# Patient Record
Sex: Female | Born: 2000
Health system: Southern US, Community
[De-identification: ages and names within clinical notes are randomized; demographics above are authoritative.]

## PROBLEM LIST (undated history)

## (undated) DIAGNOSIS — K219 Gastro-esophageal reflux disease without esophagitis: Secondary | ICD-10-CM

## (undated) DIAGNOSIS — Q673 Plagiocephaly: Secondary | ICD-10-CM

## (undated) DIAGNOSIS — D649 Anemia, unspecified: Secondary | ICD-10-CM

## (undated) DIAGNOSIS — T7840XA Allergy, unspecified, initial encounter: Secondary | ICD-10-CM

## (undated) HISTORY — DX: Anemia, unspecified: D64.9

## (undated) HISTORY — DX: Allergy, unspecified, initial encounter: T78.40XA

## (undated) HISTORY — DX: Plagiocephaly: Q67.3

## (undated) HISTORY — DX: Gastro-esophageal reflux disease without esophagitis: K21.9

---
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
## 2022-02-22 ENCOUNTER — Telehealth: Payer: Self-pay | Admitting: Family Medicine

## 2022-02-22 NOTE — Telephone Encounter (Signed)
Patient's mom informed shot record was ready. Verbalized understanding ?

## 2022-02-22 NOTE — Telephone Encounter (Signed)
Nurse- patient needing copy of shot record please,call when ready for pick up ?

## 2022-04-25 ENCOUNTER — Telehealth: Payer: Managed Care, Other (non HMO) | Admitting: Physician Assistant

## 2022-04-25 DIAGNOSIS — R3989 Other symptoms and signs involving the genitourinary system: Secondary | ICD-10-CM

## 2022-04-25 MED ORDER — NITROFURANTOIN MONOHYD MACRO 100 MG PO CAPS: 100.0000 mg | ORAL_CAPSULE | Freq: Two times a day (BID) | ORAL | 0 refills | Status: DC

## 2022-04-25 NOTE — Progress Notes (Signed)
I have spent 5 minutes in review of e-visit questionnaire, review and updating patient chart, medical decision making and response to patient.   Karrissa Parchment Cody Aleesia Henney, PA-C    

## 2022-04-25 NOTE — Progress Notes (Signed)

## 2022-07-23 ENCOUNTER — Ambulatory Visit: Payer: Managed Care, Other (non HMO) | Admitting: Family Medicine

## 2022-07-23 ENCOUNTER — Encounter: Payer: Self-pay | Admitting: Family Medicine

## 2022-07-23 VITALS — BP 116/67 | HR 92 | Temp 97.3°F | Ht 66.81 in | Wt 297.0 lb

## 2022-07-23 DIAGNOSIS — J029 Acute pharyngitis, unspecified: Secondary | ICD-10-CM | POA: Diagnosis not present

## 2022-07-23 DIAGNOSIS — H60312 Diffuse otitis externa, left ear: Secondary | ICD-10-CM | POA: Diagnosis not present

## 2022-07-23 LAB — POCT RAPID STREP A (OFFICE): Rapid Strep A Screen: NEGATIVE

## 2022-07-23 MED ORDER — CIPROFLOXACIN-DEXAMETHASONE 0.3-0.1 % OT SUSP: 4.0000 [drp] | Freq: Two times a day (BID) | OTIC | 0 refills | Status: AC

## 2022-07-23 NOTE — Progress Notes (Signed)
   Subjective:    Patient ID: Alison Bass, female    DOB: 2001/05/25, 21 y.o.   MRN: [id number]  HPI  Ear pain and soret throat started w/ recent beach trip  Patient relates a little bit of sore throat and ear pressure over the past couple days since going to the beach denies body aches fever chills sweats wheezing or difficulty breathing  Review of Systems     Objective:   Physical Exam  Eardrums are normal throat with enlarged tonsils neck no masses lungs clear      Assessment & Plan:  Rapid strep negative Viral syndrome Doubt COVID No testing indicated Call if any ongoing troubles Hold off on antibiotics currently  Patient is worried about getting a throat or sinus infection by the time she starts college next week I told her if she gets significantly worse to call us back

## 2022-09-05 ENCOUNTER — Ambulatory Visit: Payer: Managed Care, Other (non HMO)

## 2022-09-07 ENCOUNTER — Ambulatory Visit (INDEPENDENT_AMBULATORY_CARE_PROVIDER_SITE_OTHER): Payer: Managed Care, Other (non HMO) | Admitting: *Deleted

## 2022-09-07 DIAGNOSIS — Z23 Encounter for immunization: Secondary | ICD-10-CM

## 2022-09-26 ENCOUNTER — Other Ambulatory Visit (HOSPITAL_COMMUNITY): Payer: Self-pay

## 2022-10-03 ENCOUNTER — Encounter: Payer: Self-pay | Admitting: Emergency Medicine

## 2022-10-03 ENCOUNTER — Ambulatory Visit
Admission: EM | Admit: 2022-10-03 | Discharge: 2022-10-03 | Disposition: A | Discharge status: Home / Self Care | Payer: Managed Care, Other (non HMO) | Attending: Family Medicine | Admitting: Family Medicine

## 2022-10-03 ENCOUNTER — Other Ambulatory Visit: Payer: Self-pay

## 2022-10-03 DIAGNOSIS — N390 Urinary tract infection, site not specified: Secondary | ICD-10-CM

## 2022-10-03 LAB — POCT URINALYSIS DIP (MANUAL ENTRY)
Bilirubin, UA: NEGATIVE
Blood, UA: NEGATIVE
Glucose, UA: NEGATIVE mg/dL
Ketones, POC UA: NEGATIVE mg/dL
Nitrite, UA: NEGATIVE
Protein Ur, POC: NEGATIVE mg/dL
Spec Grav, UA: >=1.03 — AB (ref 1.010–1.025)
Urobilinogen, UA: 0.2 E.U./dL
pH, UA: 6 (ref 5.0–8.0)

## 2022-10-03 MED ORDER — NITROFURANTOIN MONOHYD MACRO 100 MG PO CAPS: 100.0000 mg | ORAL_CAPSULE | Freq: Two times a day (BID) | ORAL | 0 refills | Status: DC

## 2022-10-03 NOTE — ED Triage Notes (Signed)
Pt reports dysuria since  this afternoon. Pt reports history of UTI.

## 2022-10-04 LAB — URINE CULTURE

## 2022-10-05 NOTE — ED Provider Notes (Signed)
[facility]    CSN: [id number] Arrival date & time: 10/03/22  1900      History   Chief Complaint Chief Complaint  Patient presents with   Dysuria    HPI Alison Bass is a 21 y.o. female.   Presenting today with new onset dysuria that started this afternoon.  Denies fever, chills, hematuria, abdominal pain, nausea vomiting.  So far not tried anything over-the-counter for symptoms.  States history of UTIs that have started similarly.    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 No pertinent surgical history.  OB History   No obstetric history on file.      Home Medications    Prior to Admission medications   Medication Sig Start Date End Date Taking? Authorizing Provider  nitrofurantoin, macrocrystal-monohydrate, (MACROBID) 100 MG capsule Take 1 capsule (100 mg total) by mouth 2 (two) times daily. 10/03/22  Yes Volney American, PA-C  Adapalene 0.3 % gel Apply a small amount to skin every evening Patient not taking: Reported on 07/23/2022 10/23/21     Clindamycin Phos-Benzoyl Perox gel Apply a small amount to affected area every morning Patient not taking: Reported on 07/23/2022 10/23/21     doxycycline (VIBRAMYCIN) 100 MG capsule Take 1 capsule (100 mg total) by mouth  daily. Take with food Patient not taking: Reported on 07/23/2022 10/23/21     fluticasone (FLONASE) 50 MCG/ACT nasal spray Place 1 spray into both nostrils daily for 14 days. 07/03/20 07/17/20  Avegno, Darrelyn Hillock, FNP  naproxen (NAPROSYN) 375 MG tablet Take 1 tablet (375 mg total) by mouth 3 times/day as needed-between meals & bedtime. Patient not taking: Reported on 07/23/2022 09/02/21   Scot Jun, FNP  nitrofurantoin, macrocrystal-monohydrate, (MACROBID) 100 MG capsule Take 1 capsule (100 mg total) by mouth 2 (two) times daily. Patient not taking: Reported on 07/23/2022 04/25/22   Brunetta Jeans, PA-C  ipratropium (ATROVENT) 0.06 % nasal spray Place 2 sprays into both nostrils 4 (four) times daily. Patient not taking: Reported on 01/05/2019 12/07/18 10/15/20  Ok Edwards, PA-C    Family History Family History  Problem Relation Age of Onset   Diabetes Mother    Heart disease Paternal Grandfather        Died at 98    Social History Social History   Tobacco Use   Smoking status: Never   Smokeless tobacco: Never  Vaping Use   Vaping Use: Never used  Substance Use Topics   Alcohol use: No   Drug use: No     Allergies   Aloe, Cefzil [cefprozil], and Dimetapp c [phenylephrine-bromphen-codeine]   Review of Systems Review of Systems Per HPI  Physical Exam Triage Vital Signs ED Triage Vitals  Enc Vitals Group     BP 10/03/22 1910 132/88     Pulse Rate 10/03/22 1910 90  Resp 10/03/22 1910 20     Temp 10/03/22 1910 98.8 F (37.1 C)     Temp Source 10/03/22 1910 Oral     SpO2 10/03/22 1910 98 %     Weight --      Height --      Head Circumference --      Peak Flow --      Pain Score 10/03/22 1911 0     Pain Loc --      Pain Edu? --      Excl. in [place]? --    No data found.  Updated Vital Signs BP 132/88 (BP Location: Right Arm)   Pulse 90   Temp 98.8 F (37.1 C) (Oral)   Resp 20   LMP 09/09/2022 (Approximate)   SpO2 98%   Visual Acuity Right Eye Distance:    Left Eye Distance:   Bilateral Distance:    Right Eye Near:   Left Eye Near:    Bilateral Near:     Physical Exam Vitals and nursing note reviewed.  Constitutional:      Appearance: Normal appearance. She is not ill-appearing.  HENT:     Head: Atraumatic.     Mouth/Throat:     Mouth: Mucous membranes are moist.  Eyes:     Extraocular Movements: Extraocular movements intact.     Conjunctiva/sclera: Conjunctivae normal.  Cardiovascular:     Rate and Rhythm: Normal rate and regular rhythm.     Heart sounds: Normal heart sounds.  Pulmonary:     Effort: Pulmonary effort is normal.     Breath sounds: Normal breath sounds.  Abdominal:     General: Bowel sounds are normal. There is no distension.     Palpations: Abdomen is soft.     Tenderness: There is no abdominal tenderness. There is no right CVA tenderness, left CVA tenderness or guarding.  Musculoskeletal:        General: Normal range of motion.     Cervical back: Normal range of motion and neck supple.  Skin:    General: Skin is warm and dry.  Neurological:     Mental Status: She is alert and oriented to person, place, and time.  Psychiatric:        Mood and Affect: Mood normal.        Thought Content: Thought content normal.        Judgment: Judgment normal.    UC Treatments / Results  Labs (all labs ordered are listed, but only abnormal results are displayed) Labs Reviewed  URINE CULTURE - Abnormal; Notable for the following components:      Result Value   Culture MULTIPLE SPECIES PRESENT, SUGGEST RECOLLECTION (*)    All other components within normal limits  POCT URINALYSIS DIP (MANUAL ENTRY) - Abnormal; Notable for the following components:   Clarity, UA hazy (*)    Spec Grav, UA >=1.030 (*)    Leukocytes, UA Small (1+) (*)    All other components within normal limits    EKG   Radiology No results found.  Procedures Procedures (including critical care time)  Medications Ordered in UC Medications -  No data to display  Initial Impression / Assessment and Plan / UC Course  I have reviewed the triage vital signs and the nursing notes.  Pertinent labs & imaging results that were available during my care of the patient were reviewed by me and considered in my medical decision making (see chart for details).  Urinalysis with evidence of a possible UTI, start Macrobid while awaiting urine culture for confirmation.  Push fluids, Azo as needed.  Return for worsening symptoms.  Final Clinical Impressions(s) / UC Diagnoses   Final diagnoses:  Acute lower UTI   Discharge Instructions   None    ED Prescriptions     Medication Sig Dispense Auth. Provider   nitrofurantoin, macrocrystal-monohydrate, (MACROBID) 100 MG capsule Take 1 capsule (100 mg total) by mouth 2 (two) times daily. 10 capsule Volney American, [place]      PDMP not reviewed this encounter.   Volney American, [place] 10/05/22 1356

## 2023-02-08 ENCOUNTER — Encounter: Payer: Self-pay | Admitting: Nurse Practitioner

## 2023-02-08 ENCOUNTER — Ambulatory Visit: Payer: Managed Care, Other (non HMO) | Admitting: Nurse Practitioner

## 2023-02-08 VITALS — BP 128/82 | Ht 66.0 in | Wt 300.4 lb

## 2023-02-08 DIAGNOSIS — Z130 Encounter for screening for diseases of the blood and blood-forming organs and certain disorders involving the immune mechanism: Secondary | ICD-10-CM

## 2023-02-08 DIAGNOSIS — Z13220 Encounter for screening for lipoid disorders: Secondary | ICD-10-CM

## 2023-02-08 DIAGNOSIS — Z131 Encounter for screening for diabetes mellitus: Secondary | ICD-10-CM

## 2023-02-08 DIAGNOSIS — N939 Abnormal uterine and vaginal bleeding, unspecified: Secondary | ICD-10-CM | POA: Diagnosis not present

## 2023-02-08 DIAGNOSIS — E282 Polycystic ovarian syndrome: Secondary | ICD-10-CM

## 2023-02-08 DIAGNOSIS — Z1329 Encounter for screening for other suspected endocrine disorder: Secondary | ICD-10-CM

## 2023-02-08 MED ORDER — METFORMIN HCL 500 MG PO TABS: 500.0000 mg | ORAL_TABLET | Freq: Two times a day (BID) | ORAL | 2 refills | Status: DC

## 2023-02-08 MED ORDER — NORETHIN ACE-ETH ESTRAD-FE 1-20 MG-MCG PO TABS: 1.0000 | ORAL_TABLET | Freq: Every day | ORAL | 2 refills | Status: DC

## 2023-02-08 NOTE — Progress Notes (Unsigned)
Subjective:    Patient ID: Alison Bass, female    DOB: 08/13/2001, 22 y.o.   MRN: [id number]  HPI Alison Bass is being seen today to discuss her periods. They are very irregular, and last anywhere for up to one month at a time. Her periods are heavy with blood clots most of the days when she has her periods. She will go through 5-6 super pads per day. She does not have any severe cramping. She is not currently on birth control. Her step mom has some reservations about birth control pills, and advised against it due to some issues she has had in the past, so Alison Bass has not ever been on birth control. She is sexually active, and has had two female partners in the last year. She does use condoms. She has had some issues with weight loss as well, as she has been on diets in the past and lost weight, but has been gaining it back months later. She has cut back on soft drinks and carb containing foods. She has also had some issues with acne and excessive hair growth on her body. No smoking or vaping.    Current Medications, Allergies, Past Medical History, Past Surgical History, Family History and Social History were reviewed in [employer] record.    Review of Systems  Constitutional:  Positive for unexpected weight change. Negative for activity change, appetite change, fatigue and fever.  Respiratory:  Negative for cough, chest tightness, shortness of breath and wheezing.   Cardiovascular:  Negative for chest pain and palpitations.  Gastrointestinal:  Negative for constipation, diarrhea, nausea and vomiting.  Genitourinary:  Positive for menstrual problem and vaginal bleeding. Negative for pelvic pain, vaginal discharge and vaginal pain.      02/08/2023    8:52 AM  Depression screen PHQ 2/9  Decreased Interest 0  Down, Depressed, Hopeless 0  PHQ - 2 Score 0       Objective:   Physical Exam Vitals and nursing note reviewed.  Constitutional:      General: She is  not in acute distress.    Appearance: Normal appearance.  HENT:     Nose: No congestion or rhinorrhea.     Mouth/Throat:     Mouth: Mucous membranes are moist.     Pharynx: Oropharynx is clear. No posterior oropharyngeal erythema.  Eyes:     Conjunctiva/sclera: Conjunctivae normal.  Neck:     Thyroid: No thyroid mass or thyroid tenderness.     Comments: Thyroid non tender, no mass or goiter noted.  Cardiovascular:     Rate and Rhythm: Normal rate and regular rhythm.     Heart sounds: Normal heart sounds. No murmur heard. Pulmonary:     Effort: Pulmonary effort is normal. No respiratory distress.     Breath sounds: Normal breath sounds.  Chest:  Breasts:    Breasts are symmetrical.  Abdominal:     General: There is no distension.     Palpations: Abdomen is soft. There is no mass.     Tenderness: There is no abdominal tenderness.  Genitourinary:    Comments: Defers GU exam.  Lymphadenopathy:     Cervical: No cervical adenopathy.  Skin:    General: Skin is warm and dry.     Comments: She does have some mild acne scarring on her face, and acne on her back and chest. She does have some increased dark hair growth on her lateral face and lower back.  Neurological:  Mental Status: She is alert.     Gait: Gait normal.  Psychiatric:        Mood and Affect: Mood normal.        Behavior: Behavior normal.        Thought Content: Thought content normal.        Judgment: Judgment normal.    Vitals:   02/08/23 0850  BP: 128/82  Height: '5\' 6"'$  (1.676 m)  Weight: (!) 300 lb 6.4 oz (136.3 kg)  BMI (Calculated): 48.51        Assessment & Plan:   1. Screening, lipid - Lipid panel  2. Encounter for screening examination for impaired glucose regulation and diabetes mellitus - CMP14+EGFR  3. Screening for thyroid disorder - TSH  4. Screening for iron deficiency anemia - CBC with Differential/Platelet  5. PCOS (polycystic ovarian syndrome): Primary diagnosis Meds ordered  this encounter  Medications   norethindrone-ethinyl estradiol-FE (LOESTRIN FE 1/20) 1-20 MG-MCG tablet    Sig: Take 1 tablet by mouth daily. Start taking this Sunday. Use back up contraceptive with the first pack.    Dispense:  28 tablet    Refill:  2    Order Specific Question:   Supervising Provider    Answer:   Sallee Lange A [[id number]]   metFORMIN (GLUCOPHAGE) 500 MG tablet    Sig: Take 1 tablet (500 mg total) by mouth 2 (two) times daily with a meal.    Dispense:  60 tablet    Refill:  2    Order Specific Question:   Supervising Provider    Answer:   Kathyrn Drown [phone]   -Educated patient on side effects of Metformin, and to follow up if any severe side effects. -Educated patient on symptoms of PCOS. -Discussed importance of healthy diet and regular activity. Initial goal is weight loss of 10 lbs over next 3 months.  6. Abnormal uterine bleeding -Prescribed contraceptive pills to control bleeding and abnormal cycles,   -Educated patient on the risks associated with increased uterine bleeding.  -Educated patient on the risks of contraceptive pills. Call back if continued heavy or prolonged bleeding.  Return in about 3 months (around 05/09/2023).

## 2023-02-08 NOTE — Patient Instructions (Addendum)
Metformin  Mediterranean Diet A Mediterranean diet refers to food and lifestyle choices that are based on the traditions of countries located on the [employer]. It focuses on eating more fruits, vegetables, whole grains, beans, nuts, seeds, and heart-healthy fats, and eating less dairy, meat, eggs, and processed foods with added sugar, salt, and fat. This way of eating has been shown to help prevent certain conditions and improve outcomes for people who have chronic diseases, like kidney disease and heart disease. What are tips for following this plan? Reading food labels Check the serving size of packaged foods. For foods such as rice and pasta, the serving size refers to the amount of cooked product, not dry. Check the total fat in packaged foods. Avoid foods that have saturated fat or trans fats. Check the ingredient list for added sugars, such as corn syrup. Shopping  Buy a variety of foods that offer a balanced diet, including: Fresh fruits and vegetables (produce). Grains, beans, nuts, and seeds. Some of these may be available in unpackaged forms or large amounts (in bulk). Fresh seafood. Poultry and eggs. Low-fat dairy products. Buy whole ingredients instead of prepackaged foods. Buy fresh fruits and vegetables in-season from local farmers markets. Buy plain frozen fruits and vegetables. If you do not have access to quality fresh seafood, buy precooked frozen shrimp or canned fish, such as tuna, salmon, or sardines. Stock your pantry so you always have certain foods on hand, such as olive oil, canned tuna, canned tomatoes, rice, pasta, and beans. Cooking Cook foods with extra-virgin olive oil instead of using butter or other vegetable oils. Have meat as a side dish, and have vegetables or grains as your main dish. This means having meat in small portions or adding small amounts of meat to foods like pasta or stew. Use beans or vegetables instead of meat in common dishes like  chili or lasagna. Experiment with different cooking methods. Try roasting, broiling, steaming, and sauting vegetables. Add frozen vegetables to soups, stews, pasta, or rice. Add nuts or seeds for added healthy fats and plant protein at each meal. You can add these to yogurt, salads, or vegetable dishes. Marinate fish or vegetables using olive oil, lemon juice, garlic, and fresh herbs. Meal planning Plan to eat one vegetarian meal one day each week. Try to work up to two vegetarian meals, if possible. Eat seafood two or more times a week. Have healthy snacks readily available, such as: Vegetable sticks with hummus. Greek yogurt. Fruit and nut trail mix. Eat balanced meals throughout the week. This includes: Fruit: 2-3 servings a day. Vegetables: 4-5 servings a day. Low-fat dairy: 2 servings a day. Fish, poultry, or lean meat: 1 serving a day. Beans and legumes: 2 or more servings a week. Nuts and seeds: 1-2 servings a day. Whole grains: 6-8 servings a day. Extra-virgin olive oil: 3-4 servings a day. Limit red meat and sweets to only a few servings a month. Lifestyle  Cook and eat meals together with your family, when possible. Drink enough fluid to keep your urine pale yellow. Be physically active every day. This includes: Aerobic exercise like running or swimming. Leisure activities like gardening, walking, or housework. Get 7-8 hours of sleep each night. If recommended by your health care provider, drink red wine in moderation. This means 1 glass a day for nonpregnant women and 2 glasses a day for men. A glass of wine equals 5 oz (150 mL). What foods should I eat? Fruits Apples. Apricots. Avocado. Berries. Bananas.  Cherries. Dates. Figs. Grapes. Lemons. Melon. Oranges. Peaches. Plums. Pomegranate. Vegetables Artichokes. Beets. Broccoli. Cabbage. Carrots. Eggplant. Green beans. Chard. Kale. Spinach. Onions. Leeks. Peas. Squash. Tomatoes. Peppers. Radishes. Grains Whole-grain  pasta. Brown rice. Bulgur wheat. Polenta. Couscous. Whole-wheat bread. [place]. Meats and other proteins Beans. Almonds. Sunflower seeds. Pine nuts. Peanuts. [place]. Salmon. Scallops. Shrimp. [place]. Tilapia. Clams. Oysters. Eggs. Poultry without skin. Dairy Low-fat milk. Cheese. Greek yogurt. Fats and oils Extra-virgin olive oil. Avocado oil. Grapeseed oil. Beverages Water. Red wine. Herbal tea. Sweets and desserts Greek yogurt with honey. Baked apples. Poached pears. Trail mix. Seasonings and condiments Basil. Cilantro. Coriander. Cumin. Mint. Parsley. Sage. Rosemary. Tarragon. Garlic. Oregano. Thyme. Pepper. Balsamic vinegar. Tahini. Hummus. Tomato sauce. Olives. Mushrooms. The items listed above may not be a complete list of foods and beverages you can eat. Contact a dietitian for more information. What foods should I limit? This is a list of foods that should be eaten rarely or only on special occasions. Fruits Fruit canned in syrup. Vegetables Deep-fried potatoes (french fries). Grains Prepackaged pasta or rice dishes. Prepackaged cereal with added sugar. Prepackaged snacks with added sugar. Meats and other proteins Beef. Pork. Lamb. Poultry with skin. Hot dogs. Berniece Salines. Dairy Ice cream. Sour cream. Whole milk. Fats and oils Butter. Canola oil. Vegetable oil. Beef fat (tallow). Lard. Beverages Juice. Sugar-sweetened soft drinks. Beer. Liquor and spirits. Sweets and desserts Cookies. Cakes. Pies. Candy. Seasonings and condiments Mayonnaise. Pre-made sauces and marinades. The items listed above may not be a complete list of foods and beverages you should limit. Contact a dietitian for more information. Summary The Mediterranean diet includes both food and lifestyle choices. Eat a variety of fresh fruits and vegetables, beans, nuts, seeds, and whole grains. Limit the amount of red meat and sweets that you eat. If recommended by your health care provider, drink red wine in  moderation. This means 1 glass a day for nonpregnant women and 2 glasses a day for men. A glass of wine equals 5 oz (150 mL). This information is not intended to replace advice given to you by your health care provider. Make sure you discuss any questions you have with your health care provider. Document Revised: 01/08/2020 Document Reviewed: 11/05/2019 Elsevier Patient Education  [place].

## 2023-02-08 NOTE — Progress Notes (Unsigned)
   Subjective:    Patient ID: Alison Bass, female    DOB: 12-23-2000, 22 y.o.   MRN: [id number]  HPI Patient arrives to discuss irregular cycles    Review of Systems     Objective:   Physical Exam        Assessment & Plan:

## 2023-02-09 ENCOUNTER — Encounter: Payer: Self-pay | Admitting: Nurse Practitioner

## 2023-02-09 LAB — CBC WITH DIFFERENTIAL/PLATELET
Basophils Absolute: 0.1 10*3/uL (ref 0.0–0.2)
Basos: 1 %
EOS (ABSOLUTE): 0.2 10*3/uL (ref 0.0–0.4)
Eos: 2 %
Hematocrit: 36.7 % (ref 34.0–46.6)
Hemoglobin: 11 g/dL — ABNORMAL LOW (ref 11.1–15.9)
Immature Grans (Abs): 0.1 10*3/uL (ref 0.0–0.1)
Immature Granulocytes: 1 %
Lymphocytes Absolute: 2.8 10*3/uL (ref 0.7–3.1)
Lymphs: 21 %
MCH: 20.9 pg — ABNORMAL LOW (ref 26.6–33.0)
MCHC: 30 g/dL — ABNORMAL LOW (ref 31.5–35.7)
MCV: 70 fL — ABNORMAL LOW (ref 79–97)
Monocytes Absolute: 0.8 10*3/uL (ref 0.1–0.9)
Monocytes: 6 %
Neutrophils Absolute: 9.8 10*3/uL — ABNORMAL HIGH (ref 1.4–7.0)
Neutrophils: 69 %
Platelets: 467 10*3/uL — ABNORMAL HIGH (ref 150–450)
RBC: 5.26 x10E6/uL (ref 3.77–5.28)
RDW: 17.2 % — ABNORMAL HIGH (ref 11.7–15.4)
WBC: 13.8 10*3/uL — ABNORMAL HIGH (ref 3.4–10.8)

## 2023-02-09 LAB — CMP14+EGFR
ALT: 23 IU/L (ref 0–32)
AST: 20 IU/L (ref 0–40)
Albumin/Globulin Ratio: 1.4 (ref 1.2–2.2)
Albumin: 4.2 g/dL (ref 4.0–5.0)
Alkaline Phosphatase: 125 IU/L — ABNORMAL HIGH (ref 42–106)
BUN/Creatinine Ratio: 14 (ref 9–23)
BUN: 11 mg/dL (ref 6–20)
Bilirubin Total: 0.3 mg/dL (ref 0.0–1.2)
CO2: 21 mmol/L (ref 20–29)
Calcium: 9.2 mg/dL (ref 8.7–10.2)
Chloride: 102 mmol/L (ref 96–106)
Creatinine, Ser: 0.78 mg/dL (ref 0.57–1.00)
Globulin, Total: 3.1 g/dL (ref 1.5–4.5)
Glucose: 87 mg/dL (ref 70–99)
Potassium: 4.4 mmol/L (ref 3.5–5.2)
Sodium: 140 mmol/L (ref 134–144)
Total Protein: 7.3 g/dL (ref 6.0–8.5)
eGFR: 112 mL/min/{1.73_m2} (ref >59)

## 2023-02-09 LAB — LIPID PANEL
Chol/HDL Ratio: 3.7 ratio (ref 0.0–4.4)
Cholesterol, Total: 163 mg/dL (ref 100–169)
HDL: 44 mg/dL (ref >39)
LDL Chol Calc (NIH): 100 mg/dL (ref 0–109)
Triglycerides: 106 mg/dL — ABNORMAL HIGH (ref 0–89)
VLDL Cholesterol Cal: 19 mg/dL (ref 5–40)

## 2023-02-09 LAB — TSH: TSH: 2.32 u[IU]/mL (ref 0.450–4.500)

## 2023-02-15 ENCOUNTER — Other Ambulatory Visit: Payer: Self-pay | Admitting: Nurse Practitioner

## 2023-03-01 ENCOUNTER — Other Ambulatory Visit: Payer: Self-pay | Admitting: Nurse Practitioner

## 2023-03-01 MED ORDER — DESOGESTREL-ETHINYL ESTRADIOL 0.15-0.02/0.01 MG (21/5) PO TABS: 1.0000 | ORAL_TABLET | Freq: Every day | ORAL | 2 refills | Status: DC

## 2023-03-04 ENCOUNTER — Telehealth: Payer: Self-pay | Admitting: Family Medicine

## 2023-03-04 NOTE — Telephone Encounter (Signed)
Patient is requesting copy of shot record.as soon as possible

## 2023-03-06 NOTE — Telephone Encounter (Signed)
Patient informed shot records are ready for pickup.

## 2023-03-22 ENCOUNTER — Ambulatory Visit: Payer: Managed Care, Other (non HMO) | Admitting: Nurse Practitioner

## 2023-03-22 ENCOUNTER — Encounter: Payer: Self-pay | Admitting: Nurse Practitioner

## 2023-03-22 VITALS — BP 135/84 | HR 91 | Temp 99.0°F | Ht 66.0 in | Wt 291.0 lb

## 2023-03-22 DIAGNOSIS — E282 Polycystic ovarian syndrome: Secondary | ICD-10-CM

## 2023-03-22 DIAGNOSIS — N939 Abnormal uterine and vaginal bleeding, unspecified: Secondary | ICD-10-CM | POA: Diagnosis not present

## 2023-03-22 LAB — POCT URINE PREGNANCY: Preg Test, Ur: NEGATIVE

## 2023-03-22 MED ORDER — MEGESTROL ACETATE 40 MG PO TABS: ORAL_TABLET | ORAL | 0 refills | Status: DC

## 2023-03-22 NOTE — Progress Notes (Unsigned)
   Subjective:    Patient ID: Alison Bass, female    DOB: Jan 06, 2001, 22 y.o.   MRN: [id number]  HPI Heavy menstrual cycles going on for 3 months  Cycles are heavier w clots    Review of Systems     Objective:   Physical Exam        Assessment & Plan:

## 2023-03-22 NOTE — Progress Notes (Unsigned)
   Subjective:    Patient ID: Alison Bass, female    DOB: October 23, 2001, 22 y.o.   MRN: [id number]  HPI Alison Bass is being seen today for heavy menstrual bleeding. Patient was seen on 02/08/2023 with heavy bleeding, and we started her on Loestrin. Bleeding still did not stop. We switched her to Mircette, and she has been taking that for 3 weeks. She continues to have heavy bleeding with clots. She is going through 4-5 size 5 pads per day. Has been sexually active during this time, but does use condoms as well. Since taking the Mircette, she has had labile emotions, decreased energy, diarrhea and constipation. Denies dizziness, lightheadedness, or palpitations.   Review of Systems  Constitutional:  Negative for fever.  Respiratory:  Negative for chest tightness, shortness of breath and wheezing.   Cardiovascular:  Negative for chest pain and palpitations.  Genitourinary:  Positive for menstrual problem and vaginal bleeding.  Skin:  Negative for pallor.  Neurological:  Negative for weakness and light-headedness.      Objective:   Physical Exam Vitals and nursing note reviewed.  Constitutional:      Appearance: She is not ill-appearing.  Cardiovascular:     Rate and Rhythm: Normal rate and regular rhythm.     Pulses: Normal pulses.     Heart sounds: Normal heart sounds. No murmur heard. Pulmonary:     Effort: Pulmonary effort is normal. No respiratory distress.     Breath sounds: No stridor. No wheezing.  Skin:    General: Skin is warm and dry.     Coloration: Skin is not pale.  Neurological:     Mental Status: She is alert.  Psychiatric:        Mood and Affect: Mood normal.        Behavior: Behavior normal.        Thought Content: Thought content normal.        Judgment: Judgment normal.    Vitals:   03/22/23 1556  BP: 135/84  Pulse: 91  Temp: 99 F (37.2 C)  Height: 5\' 6"  (1.676 m)  Weight: 291 lb (132 kg)  SpO2: 100%  BMI (Calculated): 46.99    Results for orders  placed or performed in visit on 03/22/23  POCT urine pregnancy  Result Value Ref Range   Preg Test, Ur Negative Negative      Assessment & Plan:  1. Abnormal uterine bleeding - POCT urine pregnancy  Meds ordered this encounter  Medications   megestrol (MEGACE) 40 MG tablet    Sig: Take 3 tabs po qd x 5 d then 2 qd x 5 d then one qd    Dispense:  30 tablet    Refill:  0    Order Specific Question:   Supervising Provider    Answer:   Babs Sciara [phone]    -Educated patient that if no improvement with menstrual cycle in 7 days that she needs to contact the office.  -Educated patient on warning signs associated with anemia such as dizziness, lightheadedness, and palpitations.   2. PCOS (polycystic ovarian syndrome)  Return for Recheck 4/26.

## 2023-03-22 NOTE — Telephone Encounter (Signed)
Patient seen 03/22/23

## 2023-03-24 ENCOUNTER — Encounter: Payer: Self-pay | Admitting: Nurse Practitioner

## 2023-04-12 ENCOUNTER — Ambulatory Visit (INDEPENDENT_AMBULATORY_CARE_PROVIDER_SITE_OTHER): Payer: Managed Care, Other (non HMO) | Admitting: Nurse Practitioner

## 2023-04-12 VITALS — BP 125/82 | Ht 66.0 in | Wt 290.6 lb

## 2023-04-12 DIAGNOSIS — N939 Abnormal uterine and vaginal bleeding, unspecified: Secondary | ICD-10-CM | POA: Diagnosis not present

## 2023-04-12 DIAGNOSIS — N920 Excessive and frequent menstruation with regular cycle: Secondary | ICD-10-CM

## 2023-04-12 MED ORDER — NORETHIN ACE-ETH ESTRAD-FE 1-20 MG-MCG PO TABS: 1.0000 | ORAL_TABLET | Freq: Every day | ORAL | 11 refills | Status: DC

## 2023-04-12 NOTE — Progress Notes (Signed)
Subjective:    Patient ID: Alison Bass, female    DOB: 2001-09-06, 22 y.o.   MRN: [id number]  HPI Patient arrives for a follow up on menstrual bleeding. Patient states her cycle has finally stopped. She has had some spotting twice since her period has stopped. She has been having some episodes of dizziness while she is at work and walking a lot. She says the dizziness was a lot worse when she was on her menstrual cycle, and it has improved. Denies any palpitations.  Review of Systems  Constitutional:  Negative for fatigue and fever.  Respiratory:  Negative for cough, chest tightness, shortness of breath and wheezing.   Cardiovascular:  Negative for chest pain and palpitations.  Genitourinary:  Negative for menstrual problem and vaginal bleeding.  Neurological:  Positive for dizziness and light-headedness.      Objective:   Physical Exam Vitals and nursing note reviewed.  Constitutional:      General: She is not in acute distress. Cardiovascular:     Rate and Rhythm: Normal rate and regular rhythm.     Pulses: Normal pulses.     Heart sounds: S1 normal and S2 normal. No murmur heard. Pulmonary:     Effort: Pulmonary effort is normal. No respiratory distress.     Breath sounds: No wheezing.  Skin:    General: Skin is warm and dry.     Coloration: Skin is not pale.  Neurological:     Mental Status: She is alert.  Psychiatric:        Mood and Affect: Mood normal.        Behavior: Behavior normal.        Thought Content: Thought content normal.        Judgment: Judgment normal.    Vitals:   04/12/23 1541  BP: 125/82  Height: 5\' 6"  (1.676 m)  Weight: 290 lb 9.6 oz (131.8 kg)  BMI (Calculated): 46.93          04/12/2023    3:41 PM 03/22/2023    4:06 PM 02/08/2023    8:52 AM 07/17/2021    4:37 PM  Depression screen PHQ 2/9  Decreased Interest 0 1 0 2  Down, Depressed, Hopeless 0 1 0 1  PHQ - 2 Score 0 2 0 3  Altered sleeping 0 1  0  Tired, decreased energy 0 2   1  Change in appetite 0 2  3  Feeling bad or failure about yourself  0 0  1  Trouble concentrating 0 0  0  Moving slowly or fidgety/restless 0 0  0  Suicidal thoughts 0 0    PHQ-9 Score 0 7  8  Difficult doing work/chores Not difficult at all Not difficult at all      Assessment & Plan:  1. Menorrhagia with regular cycle - CBC with Differential/Platelet - Iron, TIBC and Ferritin Panel -Encouraged her to take the Metformin as prescribed now that we have her menstrual cycle under control. Educated her on signs and symptoms of the medicine.   2. Abnormal uterine bleeding - CBC with Differential/Platelet - Iron, TIBC and Ferritin Panel   Meds ordered this encounter  Medications   norethindrone-ethinyl estradiol-FE (LOESTRIN FE 1/20) 1-20 MG-MCG tablet    Sig: Take 1 tablet by mouth daily. Start pack on Sunday.    Dispense:  28 tablet    Refill:  11    Order Specific Question:   Supervising Provider    Answer:  Lilyan Punt A [[id number]]    Return in about 3 months (around 07/12/2023).

## 2023-04-12 NOTE — Progress Notes (Unsigned)
   Subjective:    Patient ID: Alison Bass, female    DOB: 05-04-2001, 22 y.o.   MRN: [id number]  HPI Patient arrives for a follow up on menstrual bleeding. Patient states her cycle has finally stopped.   Review of Systems     Objective:   Physical Exam        Assessment & Plan:

## 2023-04-13 ENCOUNTER — Telehealth: Payer: Managed Care, Other (non HMO) | Admitting: Family Medicine

## 2023-04-13 ENCOUNTER — Encounter: Payer: Self-pay | Admitting: Nurse Practitioner

## 2023-04-13 DIAGNOSIS — R7989 Other specified abnormal findings of blood chemistry: Secondary | ICD-10-CM | POA: Insufficient documentation

## 2023-04-13 DIAGNOSIS — J039 Acute tonsillitis, unspecified: Secondary | ICD-10-CM

## 2023-04-13 DIAGNOSIS — D72829 Elevated white blood cell count, unspecified: Secondary | ICD-10-CM | POA: Insufficient documentation

## 2023-04-13 DIAGNOSIS — D729 Disorder of white blood cells, unspecified: Secondary | ICD-10-CM | POA: Insufficient documentation

## 2023-04-13 DIAGNOSIS — D509 Iron deficiency anemia, unspecified: Secondary | ICD-10-CM | POA: Insufficient documentation

## 2023-04-13 DIAGNOSIS — R790 Abnormal level of blood mineral: Secondary | ICD-10-CM | POA: Insufficient documentation

## 2023-04-13 DIAGNOSIS — D75839 Thrombocytosis, unspecified: Secondary | ICD-10-CM | POA: Insufficient documentation

## 2023-04-13 LAB — CBC WITH DIFFERENTIAL/PLATELET
Basophils Absolute: 0.1 10*3/uL (ref 0.0–0.2)
Basos: 1 %
EOS (ABSOLUTE): 0.2 10*3/uL (ref 0.0–0.4)
Eos: 1 %
Hematocrit: 37.4 % (ref 34.0–46.6)
Hemoglobin: 11 g/dL — ABNORMAL LOW (ref 11.1–15.9)
Immature Grans (Abs): 0 10*3/uL (ref 0.0–0.1)
Immature Granulocytes: 0 %
Lymphocytes Absolute: 3.3 10*3/uL — ABNORMAL HIGH (ref 0.7–3.1)
Lymphs: 22 %
MCH: 20.1 pg — ABNORMAL LOW (ref 26.6–33.0)
MCHC: 29.4 g/dL — ABNORMAL LOW (ref 31.5–35.7)
MCV: 68 fL — ABNORMAL LOW (ref 79–97)
Monocytes Absolute: 0.9 10*3/uL (ref 0.1–0.9)
Monocytes: 6 %
Neutrophils Absolute: 10.4 10*3/uL — ABNORMAL HIGH (ref 1.4–7.0)
Neutrophils: 70 %
Platelets: 478 10*3/uL — ABNORMAL HIGH (ref 150–450)
RBC: 5.48 x10E6/uL — ABNORMAL HIGH (ref 3.77–5.28)
RDW: 15.9 % — ABNORMAL HIGH (ref 11.7–15.4)
WBC: 15 10*3/uL — ABNORMAL HIGH (ref 3.4–10.8)

## 2023-04-13 LAB — IRON,TIBC AND FERRITIN PANEL
Ferritin: 13 ng/mL — ABNORMAL LOW (ref 15–77)
Iron Saturation: 4 % — CL (ref 15–55)
Iron: 16 ug/dL — ABNORMAL LOW (ref 27–159)
Total Iron Binding Capacity: 412 ug/dL (ref 250–450)
UIBC: 396 ug/dL (ref 131–425)

## 2023-04-13 MED ORDER — AMOXICILLIN 875 MG PO TABS: 875.0000 mg | ORAL_TABLET | Freq: Two times a day (BID) | ORAL | 0 refills | Status: AC

## 2023-04-13 NOTE — Progress Notes (Signed)

## 2023-04-16 ENCOUNTER — Ambulatory Visit: Payer: Managed Care, Other (non HMO) | Admitting: Family Medicine

## 2023-05-03 ENCOUNTER — Ambulatory Visit (INDEPENDENT_AMBULATORY_CARE_PROVIDER_SITE_OTHER): Payer: Managed Care, Other (non HMO) | Admitting: Nurse Practitioner

## 2023-05-03 ENCOUNTER — Encounter: Payer: Self-pay | Admitting: Nurse Practitioner

## 2023-05-03 VITALS — BP 116/81 | HR 112 | Temp 98.4°F | Ht 66.0 in

## 2023-05-03 DIAGNOSIS — D500 Iron deficiency anemia secondary to blood loss (chronic): Secondary | ICD-10-CM | POA: Diagnosis not present

## 2023-05-03 DIAGNOSIS — D729 Disorder of white blood cells, unspecified: Secondary | ICD-10-CM

## 2023-05-03 DIAGNOSIS — N939 Abnormal uterine and vaginal bleeding, unspecified: Secondary | ICD-10-CM | POA: Diagnosis not present

## 2023-05-03 DIAGNOSIS — R790 Abnormal level of blood mineral: Secondary | ICD-10-CM

## 2023-05-03 DIAGNOSIS — N920 Excessive and frequent menstruation with regular cycle: Secondary | ICD-10-CM

## 2023-05-03 NOTE — Progress Notes (Signed)
   Subjective:    Patient ID: Alison Bass, female    DOB: 27-Jan-2001, 22 y.o.   MRN: [id number]  HPI  Follow up on irregular menstrual cycle  Cycle ended on 04/06/23 and restarted and ended 04/26/23. Has started taking her birth control pills.  No breakthrough bleeding at this point.  Has been on them for about a week.  Denies any mood swings.  Has started taking oral iron pills over-the-counter. States the day after she was seen on 4/26, she did an e-visit and was started on amoxicillin for possible tonsillitis.  Had 2 visits to local urgent care after this.  Completed the first antibiotic and started a second one.  Finished that antibiotic this morning.  The second visit was for ear pain which has improved.  Review of Systems  Constitutional:  Negative for fever.  HENT:  Positive for ear pain and sinus pressure. Negative for sore throat.   Respiratory:  Negative for cough, chest tightness, shortness of breath and wheezing.        Objective:   Physical Exam NAD.  Alert, oriented.  TMs clear effusion, no erythema.  Nasal mucosa mildly boggy.  Pharynx clear.  1-2+ tonsils but patient states this is normal for her.  No exudate noted.  Neck supple with mild soft anterior adenopathy.  Lungs clear.  Heart regular rate rhythm. Today's Vitals   05/03/23 1009  BP: 116/81  Pulse: (!) 112  Temp: 98.4 F (36.9 C)  SpO2: 98%  Height: 5\' 6"  (1.676 m)   Body mass index is 46.9 kg/m.        Assessment & Plan:   Problem List Items Addressed This Visit       Genitourinary   Abnormal uterine bleeding     Other   Iron deficiency anemia - Primary   Relevant Orders   CBC with Differential/Platelet   Iron, TIBC and Ferritin Panel   Low iron stores   Low serum ferritin level   Menorrhagia with regular cycle   Neutrophilia   The illness around the time of the lab work may explain the elevated WBC count but this will be rechecked with her CBC in a couple of weeks. Continue daily  oral iron therapy.  Start OTC multivitamin as well. Continue oral contraceptive, call back if any heavy or prolonged bleeding. Repeat labs ordered to be completed in 2 weeks.  Patient verbalizes understanding. Recommend preventive health physical in the next few months. Return in about 6 months (around 11/03/2023).

## 2023-05-03 NOTE — Patient Instructions (Signed)
Repeat labs in 2 months.

## 2023-05-05 ENCOUNTER — Other Ambulatory Visit: Payer: Self-pay | Admitting: Nurse Practitioner

## 2023-05-10 ENCOUNTER — Telehealth: Payer: Managed Care, Other (non HMO) | Admitting: Nurse Practitioner

## 2023-05-10 DIAGNOSIS — R300 Dysuria: Secondary | ICD-10-CM

## 2023-05-10 MED ORDER — NITROFURANTOIN MONOHYD MACRO 100 MG PO CAPS
100.0000 mg | ORAL_CAPSULE | Freq: Two times a day (BID) | ORAL | 0 refills | Status: AC
Start: 2023-05-10 — End: 2023-05-15

## 2023-05-10 NOTE — Progress Notes (Signed)

## 2023-05-20 ENCOUNTER — Telehealth: Payer: Managed Care, Other (non HMO) | Admitting: Physician Assistant

## 2023-05-20 DIAGNOSIS — N390 Urinary tract infection, site not specified: Secondary | ICD-10-CM

## 2023-05-20 NOTE — Progress Notes (Signed)
Because you were just treated for this via evisit with recurring symptoms and need for exam and testing, I feel your condition warrants further evaluation and I recommend that you be seen in a face to face visit.   NOTE: There will be NO CHARGE for this eVisit   If you are having a true medical emergency please call 911.      For an urgent face to face visit, [facility] has eight urgent care centers for your convenience:   NEW!! [facility] at [facility] Get Driving Directions [phone] [address] [place], [address]    [facility]gent Care Center at [facility] Get Driving Directions [phone] [address] [place], [name] [address]   [facility] Health Urgent Care Center [facility]) Get Driving Directions [phone] [address] [place], [name] [address]  [facility] Health Urgent Care Center [facility] [place]) Get Driving Directions [phone] [address] [place],  [name]  [address]  [facility] Health Urgent Care Center [facility] - at [employer]  [phone] [address] [place],  [name] [address]   [facility] Health Urgent Care at [facility] Get Driving Directions [phone] [address] [place], [name] [address]   [facility] Health Urgent Care at [facility] Get Driving Directions  [phone] [address] [place], [name] [address]   [facility] Health Urgent Care at [facility] Directions [phone] [address] [place], [name] [address]  Your MyChart E-visit questionnaire answers were reviewed by a board certified advanced clinical practitioner to complete your personal care plan based on your specific symptoms.  Thank you for using e-Visits.

## 2023-05-21 ENCOUNTER — Other Ambulatory Visit: Payer: Self-pay | Admitting: Nurse Practitioner

## 2023-09-16 ENCOUNTER — Telehealth: Payer: Managed Care, Other (non HMO) | Admitting: Nurse Practitioner

## 2023-09-16 DIAGNOSIS — R3989 Other symptoms and signs involving the genitourinary system: Secondary | ICD-10-CM | POA: Diagnosis not present

## 2023-09-16 MED ORDER — CEPHALEXIN 500 MG PO CAPS
500.0000 mg | ORAL_CAPSULE | Freq: Two times a day (BID) | ORAL | 0 refills | Status: AC
Start: 2023-09-16 — End: 2023-09-23

## 2023-09-16 NOTE — Progress Notes (Signed)

## 2023-09-21 LAB — CBC WITH DIFFERENTIAL/PLATELET
Basophils Absolute: 0.1 10*3/uL (ref 0.0–0.2)
Basos: 1 %
EOS (ABSOLUTE): 0.1 10*3/uL (ref 0.0–0.4)
Eos: 1 %
Hematocrit: 42 % (ref 34.0–46.6)
Hemoglobin: 13.2 g/dL (ref 11.1–15.9)
Immature Grans (Abs): 0 10*3/uL (ref 0.0–0.1)
Immature Granulocytes: 0 %
Lymphocytes Absolute: 2.3 10*3/uL (ref 0.7–3.1)
Lymphs: 19 %
MCH: 24.9 pg — ABNORMAL LOW (ref 26.6–33.0)
MCHC: 31.4 g/dL — ABNORMAL LOW (ref 31.5–35.7)
MCV: 79 fL (ref 79–97)
Monocytes Absolute: 0.6 10*3/uL (ref 0.1–0.9)
Monocytes: 5 %
Neutrophils Absolute: 9.1 10*3/uL — ABNORMAL HIGH (ref 1.4–7.0)
Neutrophils: 74 %
Platelets: 410 10*3/uL (ref 150–450)
RBC: 5.3 x10E6/uL — ABNORMAL HIGH (ref 3.77–5.28)
RDW: 14.9 % (ref 11.7–15.4)
WBC: 12.2 10*3/uL — ABNORMAL HIGH (ref 3.4–10.8)

## 2023-09-21 LAB — IRON,TIBC AND FERRITIN PANEL
Ferritin: 69 ng/mL (ref 15–77)
Iron Saturation: 13 % — ABNORMAL LOW (ref 15–55)
Iron: 44 ug/dL (ref 27–159)
Total Iron Binding Capacity: 350 ug/dL (ref 250–450)
UIBC: 306 ug/dL (ref 131–425)

## 2023-09-25 ENCOUNTER — Ambulatory Visit: Payer: Managed Care, Other (non HMO) | Admitting: Nurse Practitioner

## 2023-09-25 VITALS — BP 124/79 | HR 106 | Temp 97.7°F | Ht 66.0 in | Wt 294.8 lb

## 2023-09-25 DIAGNOSIS — R790 Abnormal level of blood mineral: Secondary | ICD-10-CM

## 2023-09-25 DIAGNOSIS — E282 Polycystic ovarian syndrome: Secondary | ICD-10-CM

## 2023-09-25 DIAGNOSIS — D72828 Other elevated white blood cell count: Secondary | ICD-10-CM | POA: Diagnosis not present

## 2023-09-25 DIAGNOSIS — Z23 Encounter for immunization: Secondary | ICD-10-CM | POA: Diagnosis not present

## 2023-09-25 DIAGNOSIS — Z79899 Other long term (current) drug therapy: Secondary | ICD-10-CM

## 2023-09-25 DIAGNOSIS — D500 Iron deficiency anemia secondary to blood loss (chronic): Secondary | ICD-10-CM

## 2023-09-25 DIAGNOSIS — Z3041 Encounter for surveillance of contraceptive pills: Secondary | ICD-10-CM | POA: Diagnosis not present

## 2023-09-25 DIAGNOSIS — L700 Acne vulgaris: Secondary | ICD-10-CM

## 2023-09-25 MED ORDER — SPIRONOLACTONE 25 MG PO TABS
25.0000 mg | ORAL_TABLET | Freq: Every day | ORAL | 0 refills | Status: DC
Start: 2023-09-25 — End: 2023-12-20

## 2023-09-26 ENCOUNTER — Encounter: Payer: Self-pay | Admitting: Nurse Practitioner

## 2023-09-26 NOTE — Progress Notes (Signed)
Subjective:    Patient ID: Alison Bass, female    DOB: 08-13-2001, 22 y.o.   MRN: [id number]  HPI Presents for recheck since starting oral contraceptives.  Cycles are now regular with light flow lasting about 5 days.  Doing much better since starting her pill.  Sexually active with a female partner, uses condoms consistently.  States she did a self swab for STD testing this year which was all negative.  Results unavailable during office visit.  Plans to start her metformin soon, had several illnesses which delayed her starting this.  Has been taking a multivitamin with iron daily.  History of PCOS.  Acne is much improved but still has some issues particularly on her back.   Review of Systems  Constitutional:  Negative for fatigue and fever.  Respiratory:  Negative for cough, chest tightness and shortness of breath.   Cardiovascular:  Negative for chest pain.      09/25/2023    9:40 AM  Depression screen PHQ 2/9  Decreased Interest 0  Down, Depressed, Hopeless 0  PHQ - 2 Score 0  Altered sleeping 0  Tired, decreased energy 0  Change in appetite 0  Feeling bad or failure about yourself  0  Trouble concentrating 0  Moving slowly or fidgety/restless 0  Suicidal thoughts 0  PHQ-9 Score 0  Difficult doing work/chores Not difficult at all      09/25/2023    9:41 AM 03/22/2023    4:06 PM  GAD 7 : Generalized Anxiety Score  Nervous, Anxious, on Edge 1 1  Control/stop worrying 0 0  Worry too much - different things 0 0  Trouble relaxing 0 0  Restless 0 0  Easily annoyed or irritable 1 3  Afraid - awful might happen 1 0  Total GAD 7 Score 3 4  Anxiety Difficulty Not difficult at all Not difficult at all          Objective:   Physical Exam NAD.  Alert, oriented.  Calm cheerful affect.  Lungs clear.  Heart regular rate rhythm.  Continues to have some fine papular acne around the margins of the face and slightly on the upper back area.  No hirsutism noted. Today's Vitals    09/25/23 0925  BP: 124/79  Pulse: (!) 106  Temp: 97.7 F (36.5 C)  SpO2: 97%  Weight: 294 lb 12.8 oz (133.7 kg)  Height: 5\' 6"  (1.676 m)   Body mass index is 47.58 kg/m.  Results for orders placed or performed in visit on 05/03/23  CBC with Differential/Platelet  Result Value Ref Range   WBC 12.2 (H) 3.4 - 10.8 x10E3/uL   RBC 5.30 (H) 3.77 - 5.28 x10E6/uL   Hemoglobin 13.2 11.1 - 15.9 g/dL   Hematocrit 40.9 81.1 - 46.6 %   MCV 79 79 - 97 fL   MCH 24.9 (L) 26.6 - 33.0 pg   MCHC 31.4 (L) 31.5 - 35.7 g/dL   RDW 91.4 78.2 - 95.6 %   Platelets 410 150 - 450 x10E3/uL   Neutrophils 74 Not Estab. %   Lymphs 19 Not Estab. %   Monocytes 5 Not Estab. %   Eos 1 Not Estab. %   Basos 1 Not Estab. %   Neutrophils Absolute 9.1 (H) 1.4 - 7.0 x10E3/uL   Lymphocytes Absolute 2.3 0.7 - 3.1 x10E3/uL   Monocytes Absolute 0.6 0.1 - 0.9 x10E3/uL   EOS (ABSOLUTE) 0.1 0.0 - 0.4 x10E3/uL   Basophils Absolute 0.1 0.0 -  0.2 x10E3/uL   Immature Granulocytes 0 Not Estab. %   Immature Grans (Abs) 0.0 0.0 - 0.1 x10E3/uL  Iron, TIBC and Ferritin Panel  Result Value Ref Range   Total Iron Binding Capacity 350 250 - 450 ug/dL   UIBC 161 096 - 045 ug/dL   Iron 44 27 - 409 ug/dL   Iron Saturation 13 (L) 15 - 55 %   Ferritin 69 15 - 77 ng/mL          Assessment & Plan:   Problem List Items Addressed This Visit       Endocrine   PCOS (polycystic ovarian syndrome)   Relevant Medications   spironolactone (ALDACTONE) 25 MG tablet     Musculoskeletal and Integument   Acne vulgaris   Relevant Medications   spironolactone (ALDACTONE) 25 MG tablet     Other   Iron deficiency anemia   Low iron stores   Low serum ferritin level   Neutrophilia   Relevant Orders   CBC with Differential/Platelet   Other Visit Diagnoses     Encounter for surveillance of contraceptive pills    -  Primary   High risk medication use       Relevant Orders   Potassium   Immunization due       Relevant Orders    Flu vaccine trivalent PF, 6mos and older(Flulaval,Afluria,Fluarix,Fluzone) (Completed)      Meds ordered this encounter  Medications   spironolactone (ALDACTONE) 25 MG tablet    Sig: Take 1 tablet (25 mg total) by mouth daily. For acne    Dispense:  90 tablet    Refill:  0    Order Specific Question:   Supervising Provider    Answer:   Lilyan Punt A [[id number]]   Iron and ferritin stores are improving.  Continue daily multivitamin with iron.  This should continue to improve now that patient's menorrhagia has greatly improved. Continue oral contraceptive as directed.  Again reviewed safe sex issues. Start low-dose spironolactone to see if this will help her acne most likely associated with PCOS. Patient has had mild elevation in white blood cell and neutrophil count over the past 7 months.  She states that each of these times she was dealing with an illness. Recheck potassium and CBC in 3 months.  Further follow-up will be determined at that time.  Call back sooner if any problems. Flu vaccine today. Recommend preventive health physical.

## 2023-10-03 ENCOUNTER — Ambulatory Visit: Payer: Managed Care, Other (non HMO) | Admitting: Nurse Practitioner

## 2023-10-03 VITALS — BP 122/74 | HR 104 | Temp 98.6°F | Ht 66.0 in | Wt 295.4 lb

## 2023-10-03 DIAGNOSIS — R300 Dysuria: Secondary | ICD-10-CM | POA: Diagnosis not present

## 2023-10-03 LAB — POCT URINALYSIS DIP (CLINITEK)
Bilirubin, UA: NEGATIVE
Blood, UA: NEGATIVE
Glucose, UA: NEGATIVE mg/dL
Ketones, POC UA: NEGATIVE mg/dL
Leukocytes, UA: NEGATIVE
Nitrite, UA: NEGATIVE
Spec Grav, UA: >=1.03 — AB (ref 1.010–1.025)
Urobilinogen, UA: 0.2 U/dL
pH, UA: 6 (ref 5.0–8.0)

## 2023-10-03 MED ORDER — NITROFURANTOIN MONOHYD MACRO 100 MG PO CAPS: 100.0000 mg | ORAL_CAPSULE | Freq: Two times a day (BID) | ORAL | 0 refills | Status: DC

## 2023-10-03 NOTE — Patient Instructions (Signed)
Pyridium for 48 hours then discontinue

## 2023-10-04 ENCOUNTER — Encounter: Payer: Self-pay | Admitting: Nurse Practitioner

## 2023-10-04 NOTE — Progress Notes (Signed)
   Subjective:    Patient ID: Alison Bass, female    DOB: [birth year], 22 y.o.   MRN: [id number]  HPI Presents for complaints of urinary symptoms.  Was treated for UTI on 09/16/2023 by an e-visit, prescribed Keflex.  Her symptoms resolved at that time.  On 10/7 she began having symptoms again.  Now mainly having burning at the end of urination.  Urinary frequency and urgency, voiding small amounts.  No fever.  No incontinence.  Same sexual partner.  Is on the third week of her oral contraceptives, no missed pills.  Patient states she had STD testing at urgent care a couple of months ago which was negative.  No vaginal discharge itching burning or pelvic pain.   Review of Systems  Constitutional:  Negative for fever.  Respiratory:  Negative for cough, chest tightness and shortness of breath.   Cardiovascular:  Negative for chest pain.  Genitourinary:  Positive for dysuria, frequency and urgency. Negative for enuresis, flank pain, genital sores, hematuria, menstrual problem and pelvic pain.       Objective:   Physical Exam NAD.  Alert, oriented.  Lungs clear.  Heart regular rate rhythm.  No CVA tenderness to percussion.  Abdomen soft nondistended nontender.  Today's Vitals   10/03/23 1535  BP: 122/74  Pulse: (!) 104  Temp: 98.6 F (37 C)  SpO2: 100%  Weight: 295 lb 6.4 oz (134 kg)  Height: 5\' 6"  (1.676 m)   Body mass index is 47.68 kg/m.  Results for orders placed or performed in visit on 10/03/23  POCT URINALYSIS DIP (CLINITEK)  Result Value Ref Range   Color, UA orange (A) yellow   Clarity, UA clear clear   Glucose, UA negative negative mg/dL   Bilirubin, UA negative negative   Ketones, POC UA negative negative mg/dL   Spec Grav, UA >=4.132 (A) 1.010 - 1.025   Blood, UA negative negative   pH, UA 6.0 5.0 - 8.0   POC PROTEIN,UA trace negative, trace   Urobilinogen, UA 0.2 0.2 or 1.0 E.U./dL   Nitrite, UA Negative Negative   Leukocytes, UA Negative Negative          Assessment & Plan:  Dysuria - Plan: POCT URINALYSIS DIP (CLINITEK), Urine Culture Meds ordered this encounter  Medications   nitrofurantoin, macrocrystal-monohydrate, (MACROBID) 100 MG capsule    Sig: Take 1 capsule (100 mg total) by mouth 2 (two) times daily.    Dispense:  10 capsule    Refill:  0    Order Specific Question:   Supervising Provider    Answer:   Lilyan Punt A [id number]   Urine culture pending. Start Macrobid as directed. May take AZO for the next 48 hours for bladder spasms as directed then discontinue. Warning signs reviewed.  Further follow-up based on culture results.  Seek help if any new or worsening symptoms.

## 2023-10-05 LAB — SPECIMEN STATUS REPORT

## 2023-10-05 LAB — URINE CULTURE

## 2023-10-23 ENCOUNTER — Ambulatory Visit: Payer: Managed Care, Other (non HMO) | Admitting: Nurse Practitioner

## 2023-10-23 ENCOUNTER — Encounter: Payer: Self-pay | Admitting: Nurse Practitioner

## 2023-10-23 VITALS — BP 137/84 | HR 90 | Temp 98.2°F | Ht 66.0 in | Wt 293.0 lb

## 2023-10-23 DIAGNOSIS — R300 Dysuria: Secondary | ICD-10-CM | POA: Insufficient documentation

## 2023-10-23 DIAGNOSIS — N342 Other urethritis: Secondary | ICD-10-CM | POA: Diagnosis not present

## 2023-10-23 LAB — POCT URINALYSIS DIP (CLINITEK)
Bilirubin, UA: NEGATIVE
Blood, UA: NEGATIVE
Glucose, UA: NEGATIVE mg/dL
Ketones, POC UA: NEGATIVE mg/dL
Nitrite, UA: NEGATIVE
Spec Grav, UA: 1.025 (ref 1.010–1.025)
Urobilinogen, UA: 0.2 U/dL
pH, UA: 6 (ref 5.0–8.0)

## 2023-10-23 NOTE — Progress Notes (Signed)
Subjective:    Patient ID: Alison Bass, female    DOB: Jul 28, 2001, 22 y.o.   MRN: [id number]  HPI Presents for c/o recurrent dysuria. This episode began 2 days ago. Pain is mostly at the opening where she urinates. Urinary frequency, "spent all day on the toilet". No fever. Symptoms come and go, improved. States she had recent STD testing at local urgent care which was negative. Results unavailable during visit. No new sexual partners. No pelvic pain.   Review of Systems  Constitutional:  Negative for fever.  Respiratory:  Negative for cough, chest tightness and shortness of breath.   Cardiovascular:  Negative for chest pain.  Genitourinary:  Positive for dysuria, frequency and urgency. Negative for enuresis, flank pain, hematuria, pelvic pain and vaginal discharge.       Objective:   Physical Exam Vitals and nursing note reviewed.  Constitutional:      General: She is not in acute distress. Cardiovascular:     Rate and Rhythm: Normal rate and regular rhythm.  Pulmonary:     Effort: Pulmonary effort is normal.     Breath sounds: Normal breath sounds.  Genitourinary:    General: Normal vulva.     Exam position: Lithotomy position.     Labia:        Right: No rash, tenderness or lesion.        Left: No rash, tenderness or lesion.      Urethra: No prolapse, urethral pain, urethral swelling or urethral lesion.     Vagina: Normal.     Cervix: Normal. No cervical motion tenderness.     Comments: No erythema at urinary meatus. Vagina: mild mucoid discharge, no color or odor. Bimanual exam: no tenderness or obvious masses.  Neurological:     Mental Status: She is alert.    Today's Vitals   10/23/23 1600  BP: 137/84  Pulse: 90  Temp: 98.2 F (36.8 C)  SpO2: 98%  Weight: 293 lb (132.9 kg)  Height: 5\' 6"  (1.676 m)   Body mass index is 47.29 kg/m. Results for orders placed or performed in visit on 10/23/23  POCT URINALYSIS DIP (CLINITEK)  Result Value Ref Range    Color, UA yellow yellow   Clarity, UA clear clear   Glucose, UA negative negative mg/dL   Bilirubin, UA negative negative   Ketones, POC UA negative negative mg/dL   Spec Grav, UA 8.756 4.332 - 1.025   Blood, UA negative negative   pH, UA 6.0 5.0 - 8.0   POC PROTEIN,UA trace negative, trace   Urobilinogen, UA 0.2 0.2 or 1.0 E.U./dL   Nitrite, UA Negative Negative   Leukocytes, UA Trace (A) Negative           Assessment & Plan:   Problem List Items Addressed This Visit       Genitourinary   Urethritis     Other   Dysuria - Primary   Relevant Orders   POCT URINALYSIS DIP (CLINITEK) (Completed)  Refer to urology for evaluation.  1. Repeat urine culture as a precaution. Include repeat gonorrhea and chlamydia testing. Please order if she agrees. 2. If negative, we will try a medication while waiting on urology referral. 3. After she does the culture, start an antibiotic as a precaution over the weekend.  Meds ordered this encounter  Medications   nitrofurantoin, macrocrystal-monohydrate, (MACROBID) 100 MG capsule    Sig: Take 1 capsule (100 mg total) by mouth 2 (two) times daily.  Dispense:  10 capsule    Refill:  0    Order Specific Question:   Supervising Provider    Answer:   Lilyan Punt A [[id number]    Warning signs reviewed. Go to ED or UC if worse over the weekend. Follow up based on lab results.

## 2023-10-24 ENCOUNTER — Encounter: Payer: Self-pay | Admitting: Nurse Practitioner

## 2023-10-24 LAB — CBC WITH DIFFERENTIAL/PLATELET
Basophils Absolute: 0.1 10*3/uL (ref 0.0–0.2)
Basos: 1 %
EOS (ABSOLUTE): 0.2 10*3/uL (ref 0.0–0.4)
Eos: 1 %
Hematocrit: 43.5 % (ref 34.0–46.6)
Hemoglobin: 13.7 g/dL (ref 11.1–15.9)
Immature Grans (Abs): 0 10*3/uL (ref 0.0–0.1)
Immature Granulocytes: 0 %
Lymphocytes Absolute: 3.1 10*3/uL (ref 0.7–3.1)
Lymphs: 21 %
MCH: 25 pg — ABNORMAL LOW (ref 26.6–33.0)
MCHC: 31.5 g/dL (ref 31.5–35.7)
MCV: 80 fL (ref 79–97)
Monocytes Absolute: 0.9 10*3/uL (ref 0.1–0.9)
Monocytes: 6 %
Neutrophils Absolute: 10.8 10*3/uL — ABNORMAL HIGH (ref 1.4–7.0)
Neutrophils: 71 %
Platelets: 438 10*3/uL (ref 150–450)
RBC: 5.47 x10E6/uL — ABNORMAL HIGH (ref 3.77–5.28)
RDW: 15.5 % — ABNORMAL HIGH (ref 11.7–15.4)
WBC: 15.1 10*3/uL — ABNORMAL HIGH (ref 3.4–10.8)

## 2023-10-24 LAB — POTASSIUM: Potassium: 4.7 mmol/L (ref 3.5–5.2)

## 2023-10-25 ENCOUNTER — Telehealth: Payer: Self-pay

## 2023-10-25 ENCOUNTER — Other Ambulatory Visit: Payer: Self-pay | Admitting: Nurse Practitioner

## 2023-10-25 DIAGNOSIS — R300 Dysuria: Secondary | ICD-10-CM

## 2023-10-25 DIAGNOSIS — N342 Other urethritis: Secondary | ICD-10-CM

## 2023-10-25 DIAGNOSIS — Z113 Encounter for screening for infections with a predominantly sexual mode of transmission: Secondary | ICD-10-CM

## 2023-10-25 DIAGNOSIS — D72828 Other elevated white blood cell count: Secondary | ICD-10-CM

## 2023-10-25 MED ORDER — NITROFURANTOIN MONOHYD MACRO 100 MG PO CAPS: 100.0000 mg | ORAL_CAPSULE | Freq: Two times a day (BID) | ORAL | 0 refills | Status: DC

## 2023-10-25 NOTE — Telephone Encounter (Signed)
Called and spoke with patient and advised per Eber Jones NP, I saw this patient on Wednesday. Reviewed all the notes and the chart including recent labs.  Here is what I recommend:  1. Repeat urine culture as a precaution. Include gonorrhea and chlamydia testing. Please order if she agrees.  2. If negative, we will try a medication while waiting on urology referral.  3. After she does the culture, start an antibiotic as a precaution over the weekend.  4. Her white blood cell count and neutrophil count remain high. This is probably just normal for her but because of the persistence, recommend she see our local hematologist to review.  Let me know if she has any questions.  Thanks.   I saw this patient on Wednesday. Reviewed all the notes and the chart including recent labs.  Here is what I recommend:  1. Repeat urine culture as a precaution. Include gonorrhea and chlamydia testing. Please order if she agrees.  2. If negative, we will try a medication while waiting on urology referral.  3. After she does the culture, start an antibiotic as a precaution over the weekend.  4. Her white blood cell count and neutrophil count remain high. This is probably just normal for her but because of the persistence, recommend she see our local hematologist to review.  Let me know if she has any questions.  Patient verbalized understanding and orders have been placed today. Patient states she will drop by the lab today.

## 2023-10-27 LAB — URINE CULTURE

## 2023-10-28 LAB — GC/CHLAMYDIA PROBE AMP
Chlamydia trachomatis, NAA: NEGATIVE
Neisseria Gonorrhoeae by PCR: NEGATIVE

## 2023-11-20 ENCOUNTER — Inpatient Hospital Stay: Payer: No Typology Code available for payment source

## 2023-11-20 ENCOUNTER — Inpatient Hospital Stay: Payer: No Typology Code available for payment source | Attending: Oncology | Admitting: Oncology

## 2023-11-20 ENCOUNTER — Encounter: Payer: Self-pay | Admitting: Oncology

## 2023-11-20 VITALS — BP 135/81 | HR 112 | Temp 97.8°F | Resp 20 | Wt 297.8 lb

## 2023-11-20 DIAGNOSIS — Z803 Family history of malignant neoplasm of breast: Secondary | ICD-10-CM | POA: Diagnosis not present

## 2023-11-20 DIAGNOSIS — D72825 Bandemia: Secondary | ICD-10-CM

## 2023-11-20 DIAGNOSIS — D509 Iron deficiency anemia, unspecified: Secondary | ICD-10-CM

## 2023-11-20 DIAGNOSIS — D500 Iron deficiency anemia secondary to blood loss (chronic): Secondary | ICD-10-CM

## 2023-11-20 LAB — COMPREHENSIVE METABOLIC PANEL
ALT: 44 U/L (ref 0–44)
AST: 29 U/L (ref 15–41)
Albumin: 3.4 g/dL — ABNORMAL LOW (ref 3.5–5.0)
Alkaline Phosphatase: 96 U/L (ref 38–126)
Anion gap: 8 (ref 5–15)
BUN: 10 mg/dL (ref 6–20)
CO2: 27 mmol/L (ref 22–32)
Calcium: 9.2 mg/dL (ref 8.9–10.3)
Chloride: 102 mmol/L (ref 98–111)
Creatinine, Ser: 0.69 mg/dL (ref 0.44–1.00)
GFR, Estimated: >60 mL/min (ref >60)
Glucose, Bld: 98 mg/dL (ref 70–99)
Potassium: 4 mmol/L (ref 3.5–5.1)
Sodium: 137 mmol/L (ref 135–145)
Total Bilirubin: 0.3 mg/dL (ref <1.2)
Total Protein: 7.4 g/dL (ref 6.5–8.1)

## 2023-11-20 LAB — CBC WITH DIFFERENTIAL/PLATELET
Abs Immature Granulocytes: 0.06 10*3/uL (ref 0.00–0.07)
Basophils Absolute: 0.1 10*3/uL (ref 0.0–0.1)
Basophils Relative: 1 %
Eosinophils Absolute: 0.2 10*3/uL (ref 0.0–0.5)
Eosinophils Relative: 1 %
HCT: 42.3 % (ref 36.0–46.0)
Hemoglobin: 13.6 g/dL (ref 12.0–15.0)
Immature Granulocytes: 1 %
Lymphocytes Relative: 18 %
Lymphs Abs: 2.2 10*3/uL (ref 0.7–4.0)
MCH: 25.9 pg — ABNORMAL LOW (ref 26.0–34.0)
MCHC: 32.2 g/dL (ref 30.0–36.0)
MCV: 80.6 fL (ref 80.0–100.0)
Monocytes Absolute: 0.6 10*3/uL (ref 0.1–1.0)
Monocytes Relative: 5 %
Neutro Abs: 9.1 10*3/uL — ABNORMAL HIGH (ref 1.7–7.7)
Neutrophils Relative %: 74 %
Platelets: 392 10*3/uL (ref 150–400)
RBC: 5.25 MIL/uL — ABNORMAL HIGH (ref 3.87–5.11)
RDW: 15.4 % (ref 11.5–15.5)
WBC: 12.2 10*3/uL — ABNORMAL HIGH (ref 4.0–10.5)
nRBC: 0 % (ref 0.0–0.2)

## 2023-11-20 LAB — LACTATE DEHYDROGENASE: LDH: 143 U/L (ref 98–192)

## 2023-11-20 LAB — FOLATE: Folate: 10.4 ng/mL (ref >5.9)

## 2023-11-20 LAB — FERRITIN: Ferritin: 46 ng/mL (ref 11–307)

## 2023-11-20 LAB — IRON AND TIBC
Iron: 37 ug/dL (ref 28–170)
Saturation Ratios: 8 % — ABNORMAL LOW (ref 10.4–31.8)
TIBC: 451 ug/dL — ABNORMAL HIGH (ref 250–450)
UIBC: 414 ug/dL

## 2023-11-20 LAB — URIC ACID: Uric Acid, Serum: 6 mg/dL (ref 2.5–7.1)

## 2023-11-20 LAB — C-REACTIVE PROTEIN: CRP: 3 mg/dL — ABNORMAL HIGH (ref <1.0)

## 2023-11-20 LAB — VITAMIN B12: Vitamin B-12: 333 pg/mL (ref 180–914)

## 2023-11-20 LAB — SEDIMENTATION RATE: Sed Rate: 17 mm/h (ref 0–22)

## 2023-11-20 NOTE — Progress Notes (Signed)
[facility] at [facility] HEMATOLOGY NEW VISIT  [place], [place], [place]  REASON FOR REFERRAL: Leukocytosis  SUMMARY OF HEMATOLOGIC HISTORY:  Latest Reference Range & Units 02/08/23 09:56 04/12/23 16:14 09/20/23 13:53 10/23/23 16:49 11/20/23 15:33  WBC 4.0 - 10.5 K/uL 13.8 (H) 15.0 (H) 12.2 (H) 15.1 (H) 12.2 (H)  (H): Data is abnormally high   HISTORY OF PRESENT ILLNESS: Alison Bass 22 y.o. female referred for persistent leukocytosis.  She has a past medical history of PCOS and is currently on metformin and birth control.  Patient stated that she was diagnosed of iron deficiency anemia a few months ago when she had a continuous menstruation for 3 months.  She was then started on birth control and iron pills and since then her iron levels have improved significantly.  She does not report any more bleeding.The patient also reports recurrent symptoms suggestive of urinary tract infections, including dysuria and urinary urgency, which intermittently resolve and recur. She was referred to urology for further evaluation, but has not yet been seen. The patient denies any current use of steroids, significant stress, or recent strenuous exercise. She also reports occasional dizziness, which has improved since starting iron supplementation.  She denies fever, chills, weight loss, loss of appetite, night sweats.  No other complaints today.  She reports frequent sinus infections once every 2 months.  She denies smoking, alcohol use, use of other drugs.  She is sexually active.  She is preparing to go to nursing school.  She has no family history of blood disorders.  She reports breast cancer in maternal grandmother and aunt and a cousin  I have reviewed the past medical history, past surgical history, social history and family history with the patient   ALLERGIES:  is allergic to aloe, cefzil [cefprozil], and dimetapp c [phenylephrine-bromphen-codeine].  MEDICATIONS:  Current  Outpatient Medications  Medication Sig Dispense Refill   Adapalene 0.3 % gel Apply a small amount to skin every evening 45 g 3   Clindamycin Phos-Benzoyl Perox gel Apply a small amount to affected area every morning 50 g 3   metFORMIN (GLUCOPHAGE) 500 MG tablet TAKE 1 TABLET BY MOUTH 2 TIMES DAILY WITH A MEAL. 180 tablet 1   nitrofurantoin, macrocrystal-monohydrate, (MACROBID) 100 MG capsule Take 1 capsule (100 mg total) by mouth 2 (two) times daily. 10 capsule 0   norethindrone-ethinyl estradiol-FE (LOESTRIN FE 1/20) 1-20 MG-MCG tablet Take 1 tablet by mouth daily. Start pack on Sunday. 28 tablet 11   spironolactone (ALDACTONE) 25 MG tablet Take 1 tablet (25 mg total) by mouth daily. For acne 90 tablet 0   No current facility-administered medications for this visit.     REVIEW OF SYSTEMS:   Constitutional: Denies fevers, chills or night sweats Eyes: Denies blurriness of vision Ears, nose, mouth, throat, and face: Denies mucositis or sore throat Respiratory: Denies cough, dyspnea or wheezes Cardiovascular: Denies palpitation, chest discomfort or lower extremity swelling Gastrointestinal:  Denies nausea, heartburn or change in bowel habits Skin: Denies abnormal skin rashes Lymphatics: Denies new lymphadenopathy or easy bruising Neurological:Denies numbness, tingling or new weaknesses Behavioral/Psych: Mood is stable, no new changes  All other systems were reviewed with the patient and are negative.  PHYSICAL EXAMINATION:   Vitals:   11/20/23 1458 11/20/23 1510  BP: (!) 165/93 135/81  Pulse: (!) 112   Resp: 20   Temp: 97.8 F (36.6 C)   SpO2: 99%     GENERAL:alert, no distress and comfortable LYMPH:  no palpable lymphadenopathy in the cervical, axillary or inguinal LUNGS: clear to auscultation and percussion with normal breathing effort HEART: regular rate & rhythm and no murmurs and no lower extremity edema ABDOMEN:abdomen soft, non-tender and normal bowel  sounds Musculoskeletal:no cyanosis of digits and no clubbing  NEURO: alert & oriented x 3 with fluent speech  LABORATORY DATA:  I have reviewed the data as listed  Lab Results  Component Value Date   WBC 12.2 (H) 11/20/2023   NEUTROABS 9.1 (H) 11/20/2023   HGB 13.6 11/20/2023   HCT 42.3 11/20/2023   MCV 80.6 11/20/2023   PLT 392 11/20/2023      Component Value Date/Time   NA 137 11/20/2023 1533   NA 140 02/08/2023 0956   K 4.0 11/20/2023 1533   CL 102 11/20/2023 1533   CO2 27 11/20/2023 1533   GLUCOSE 98 11/20/2023 1533   BUN 10 11/20/2023 1533   BUN 11 02/08/2023 0956   CREATININE 0.69 11/20/2023 1533   CALCIUM 9.2 11/20/2023 1533   PROT 7.4 11/20/2023 1533   PROT 7.3 02/08/2023 0956   ALBUMIN 3.4 (L) 11/20/2023 1533   ALBUMIN 4.2 02/08/2023 0956   AST 29 11/20/2023 1533   ALT 44 11/20/2023 1533   ALKPHOS 96 11/20/2023 1533   BILITOT 0.3 11/20/2023 1533   BILITOT 0.3 02/08/2023 0956   GFRNONAA >60 11/20/2023 1533       Chemistry      Component Value Date/Time   NA 137 11/20/2023 1533   NA 140 02/08/2023 0956   K 4.0 11/20/2023 1533   CL 102 11/20/2023 1533   CO2 27 11/20/2023 1533   BUN 10 11/20/2023 1533   BUN 11 02/08/2023 0956   CREATININE 0.69 11/20/2023 1533      Component Value Date/Time   CALCIUM 9.2 11/20/2023 1533   ALKPHOS 96 11/20/2023 1533   AST 29 11/20/2023 1533   ALT 44 11/20/2023 1533   BILITOT 0.3 11/20/2023 1533   BILITOT 0.3 02/08/2023 0956      ASSESSMENT & PLAN:  Patient is a 22 year old female referred for leukocytosis with neutrophil predominance  Leukocytosis Persistent elevation in white blood cell count, predominantly neutrophils, since February 2024. No clear cause identified yet. No current signs of infection or inflammation. No steroid use. Patient reports less stress currently. No recent strenuous exercise.  Differential diagnosis includes frequent infections, inflammation or reactive -Order flow cytometry to rule out  leukemia. -Order inflammatory markers. -Repeat complete blood count.    Iron deficiency anemia History of prolonged menstrual bleeding leading to iron deficiency anemia. Iron levels improved with oral iron supplementation and cessation of bleeding. -Continue oral iron supplementation every other day. -Order iron panel, vitamin B12, and folate levels.  RTC in 1 month to discuss results and further management  Orders Placed This Encounter  Procedures   Ferritin    Standing Status:   Future    Number of Occurrences:   1    Standing Expiration Date:   11/19/2024   Folate    Standing Status:   Future    Number of Occurrences:   1    Standing Expiration Date:   11/19/2024   Vitamin B12    Standing Status:   Future    Number of Occurrences:   1    Standing Expiration Date:   11/19/2024   CBC with Differential/Platelet    Standing Status:   Future    Number of Occurrences:   1    Standing Expiration  Date:   11/19/2024   Comprehensive metabolic panel    Standing Status:   Future    Number of Occurrences:   1    Standing Expiration Date:   11/19/2024   Flow Cytometry, Peripheral Blood (Oncology)    Standing Status:   Future    Number of Occurrences:   1    Standing Expiration Date:   11/19/2024   Iron and TIBC    Standing Status:   Future    Number of Occurrences:   1    Standing Expiration Date:   11/19/2024    Order Specific Question:   Release to patient    Answer:   Immediate [1]   Lactate dehydrogenase    Standing Status:   Future    Number of Occurrences:   1    Standing Expiration Date:   11/19/2024   Uric acid    Standing Status:   Future    Number of Occurrences:   1    Standing Expiration Date:   11/19/2024   Sedimentation rate    Standing Status:   Future    Number of Occurrences:   1    Standing Expiration Date:   11/19/2024   C-reactive protein    Standing Status:   Future    Number of Occurrences:   1    Standing Expiration Date:   11/19/2024    The total time  spent in the appointment was 45 minutes encounter with patients including review of chart and various tests results, discussions about plan of care and coordination of care plan   All questions were answered. The patient knows to call the clinic with any problems, questions or concerns. No barriers to learning was detected.   Cindie Crumbly, MD 12/4/20245:54 PM

## 2023-11-20 NOTE — Patient Instructions (Signed)
VISIT SUMMARY:  During today's visit, we discussed your persistently elevated white blood cell count, iron deficiency anemia, polycystic ovary syndrome (PCOS), recurrent urinary tract infections, and frequent sinus infections. We reviewed your current symptoms, family history, and ongoing treatments.  YOUR PLAN:  -ELEVATED WHITE BLOOD CELL COUNT: Your white blood cell count has been high since February 2024, but we haven't found a clear cause yet. We will do more tests, including flow cytometry to check for leukemia, inflammatory markers, and a repeat complete blood count to gather more information.  -IRON DEFICIENCY ANEMIA: Iron deficiency anemia means you have low iron levels, likely due to prolonged menstrual bleeding. Your iron levels have improved with oral supplements. Continue taking your iron supplements every other day. We will also check your iron panel, vitamin B12, and folate levels.  -RECURRENT URINARY TRACT INFECTIONS: You have had symptoms suggesting urinary tract infections that come and go. You are waiting to see a urologist. Continue to monitor your symptoms.  -FREQUENT SINUS INFECTIONS: You have frequent sinus infections, which may be related to your family history. Continue to monitor your symptoms.  INSTRUCTIONS:  We will review your lab results and reassess your condition

## 2023-11-20 NOTE — Assessment & Plan Note (Signed)
Persistent elevation in white blood cell count, predominantly neutrophils, since February 2024. No clear cause identified yet. No current signs of infection or inflammation. No steroid use. Patient reports less stress currently. No recent strenuous exercise.  Differential diagnosis includes frequent infections, inflammation or reactive -Order flow cytometry to rule out leukemia. -Order inflammatory markers. -Repeat complete blood count.

## 2023-11-20 NOTE — Assessment & Plan Note (Signed)
History of prolonged menstrual bleeding leading to iron deficiency anemia. Iron levels improved with oral iron supplementation and cessation of bleeding. -Continue oral iron supplementation every other day. -Order iron panel, vitamin B12, and folate levels.

## 2023-11-22 LAB — SURGICAL PATHOLOGY

## 2023-11-25 LAB — FLOW CYTOMETRY

## 2023-12-17 ENCOUNTER — Ambulatory Visit (INDEPENDENT_AMBULATORY_CARE_PROVIDER_SITE_OTHER): Payer: No Typology Code available for payment source | Admitting: Physician Assistant

## 2023-12-17 ENCOUNTER — Encounter: Payer: Self-pay | Admitting: Physician Assistant

## 2023-12-17 VITALS — BP 125/83 | HR 95 | Temp 98.4°F | Ht 66.0 in | Wt 301.0 lb

## 2023-12-17 DIAGNOSIS — J351 Hypertrophy of tonsils: Secondary | ICD-10-CM | POA: Diagnosis not present

## 2023-12-17 DIAGNOSIS — J029 Acute pharyngitis, unspecified: Secondary | ICD-10-CM

## 2023-12-17 LAB — POCT RAPID STREP A (OFFICE): Rapid Strep A Screen: NEGATIVE

## 2023-12-17 NOTE — Progress Notes (Signed)
 Acute Office Visit  Subjective:     Patient ID: Alison Bass, female    DOB: 06/29/2001, 22 y.o.   MRN: [id number]   HPI Patient is in today for sore throat x 3 weeks. She reports going to urgent care for symptoms on December 8.  At the time she states she was negative for strep however was prescribed prednisone  and amoxicillin  for symptoms.  Following this course of antibiotics she began experiencing symptoms again, and was prescribed cefdinir at that time.  She reports finishing antibiotics yesterday and once again is experiencing symptoms of sore throat and feeling of swollen tonsils.  She denies fevers, she reports eating and drinking per usual.  She states previous discussion of referral to ENT, but never has been seen by ENT.  Review of Systems  Constitutional:  Negative for chills and fever.  HENT:  Positive for sore throat. Negative for congestion and ear pain.   Respiratory:  Negative for cough and wheezing.   Cardiovascular:  Negative for chest pain.  Gastrointestinal: Negative.   Neurological:  Negative for headaches.        Objective:     BP 125/83   Pulse 95   Temp 98.4 F (36.9 C)   Ht 5' 6 (1.676 m)   Wt (!) 301 lb (136.5 kg)   SpO2 98%   BMI 48.58 kg/m   Physical Exam Vitals reviewed.  Constitutional:      General: She is not in acute distress.    Appearance: Normal appearance.  HENT:     Right Ear: Tympanic membrane normal.     Left Ear: Tympanic membrane normal.     Nose: Nose normal.     Mouth/Throat:     Mouth: Mucous membranes are moist.     Pharynx: Oropharynx is clear. Posterior oropharyngeal erythema present.     Tonsils: 2+ on the right. 2+ on the left.  Eyes:     Extraocular Movements: Extraocular movements intact.     Conjunctiva/sclera: Conjunctivae normal.  Cardiovascular:     Rate and Rhythm: Normal rate and regular rhythm.     Heart sounds: No murmur heard.    No friction rub. No gallop.  Pulmonary:     Effort:  Pulmonary effort is normal.     Breath sounds: Normal breath sounds. No stridor. No wheezing, rhonchi or rales.  Musculoskeletal:        General: Normal range of motion.  Skin:    General: Skin is warm and dry.     Capillary Refill: Capillary refill takes less than 2 seconds.  Neurological:     General: No focal deficit present.     Mental Status: She is alert and oriented to person, place, and time.  Psychiatric:        Mood and Affect: Mood normal.        Behavior: Behavior normal.     Results for orders placed or performed in visit on 12/17/23  POCT rapid strep A  Result Value Ref Range   Rapid Strep A Screen Negative Negative        Assessment & Plan:  Enlarged tonsils  Sore throat -     POCT rapid strep A -     Culture, Group A Strep   Patient appears stable today.  Physical exam negative for acute infection at this time, tonsils however were enlarged.  Rapid strep negative, will send for culture and treat pending results.  With 3-week duration of symptoms and enlarged  tonsils referring patient to ENT for further workup.  Symptomatic management reviewed with patient to include Tylenol  and ibuprofen  for pain relief, throat lozenges, salt water gargles, adequate fluid intake.  Patient to follow-up if symptoms worsen, or if she is experiencing difficulty swallowing.  Return in about 3 days (around 12/20/2023) for next scheduled appointment .  Charmaine Nashea Chumney, PA-C

## 2023-12-20 ENCOUNTER — Ambulatory Visit (INDEPENDENT_AMBULATORY_CARE_PROVIDER_SITE_OTHER): Payer: No Typology Code available for payment source | Admitting: Nurse Practitioner

## 2023-12-20 ENCOUNTER — Encounter: Payer: Self-pay | Admitting: Nurse Practitioner

## 2023-12-20 VITALS — BP 101/69 | HR 112 | Temp 98.1°F | Ht 66.0 in | Wt 297.0 lb

## 2023-12-20 DIAGNOSIS — L700 Acne vulgaris: Secondary | ICD-10-CM | POA: Diagnosis not present

## 2023-12-20 DIAGNOSIS — Z3041 Encounter for surveillance of contraceptive pills: Secondary | ICD-10-CM | POA: Diagnosis not present

## 2023-12-20 DIAGNOSIS — E282 Polycystic ovarian syndrome: Secondary | ICD-10-CM | POA: Diagnosis not present

## 2023-12-20 LAB — CULTURE, GROUP A STREP: Strep A Culture: NEGATIVE

## 2023-12-20 MED ORDER — NORETHIN ACE-ETH ESTRAD-FE 1-20 MG-MCG PO TABS: 1.0000 | ORAL_TABLET | Freq: Every day | ORAL | 3 refills | Status: AC

## 2023-12-20 MED ORDER — SPIRONOLACTONE 25 MG PO TABS: 25.0000 mg | ORAL_TABLET | Freq: Every day | ORAL | 3 refills | Status: AC

## 2023-12-20 NOTE — Progress Notes (Signed)
   Subjective:    Patient ID: Alison Bass, female    DOB: [birth year], 22 y.o.   MRN: [id number]  HPI Presents for recheck on her PCOS and to get refills on her oral contraceptives.  Cycles are regular with normal flow lasting 5 to 7 days.  No missed pills.  Very satisfied with how her cycles are going at this point.  Continues follow-up with hematology. Patient stopped metformin  due to diarrhea.  Has plans to start working out on a regular basis. Acne much improved with oral contraceptive, spironolactone  and topical meds.   Review of Systems  Respiratory:  Negative for cough, chest tightness, shortness of breath and wheezing.   Genitourinary:  Negative for menstrual problem.       Objective:   Physical Exam NAD.  Alert, oriented.  Calm cheerful affect.  Lungs clear.  Heart regular rate rhythm. Today's Vitals   12/20/23 1316  BP: 101/69  Pulse: (!) 112  Temp: 98.1 F (36.7 C)  SpO2: 97%  Weight: 297 lb (134.7 kg)  Height: 5' 6 (1.676 m)   Body mass index is 47.94 kg/m.  Last serum potassium dated 11/20/2023 was 4.0.       Assessment & Plan:   Problem List Items Addressed This Visit       Endocrine   PCOS (polycystic ovarian syndrome)   Relevant Medications   spironolactone  (ALDACTONE ) 25 MG tablet     Musculoskeletal and Integument   Acne vulgaris   Relevant Medications   norethindrone-ethinyl estradiol -FE (LOESTRIN FE 1/20) 1-20 MG-MCG tablet   spironolactone  (ALDACTONE ) 25 MG tablet   Other Visit Diagnoses       Oral contraceptive pill surveillance    -  Primary      Meds ordered this encounter  Medications   norethindrone-ethinyl estradiol -FE (LOESTRIN FE 1/20) 1-20 MG-MCG tablet    Sig: Take 1 tablet by mouth daily. Start pack on Sunday.    Dispense:  84 tablet    Refill:  3    Supervising Provider:   ALPHONSA HAMILTON A [[id number]]   spironolactone  (ALDACTONE ) 25 MG tablet    Sig: Take 1 tablet (25 mg total) by mouth daily. For acne    Dispense:   90 tablet    Refill:  3    Supervising Provider:   ALPHONSA HAMILTON A [[id number]]   Continue current medication regimen as directed. Recommend physical with Pap smear after age 89 next year. Call back in the meantime if any problems. Continue follow-up with hematology.

## 2023-12-26 ENCOUNTER — Other Ambulatory Visit: Payer: Self-pay | Admitting: Nurse Practitioner

## 2023-12-26 DIAGNOSIS — E282 Polycystic ovarian syndrome: Secondary | ICD-10-CM

## 2023-12-26 DIAGNOSIS — L700 Acne vulgaris: Secondary | ICD-10-CM

## 2024-01-02 ENCOUNTER — Inpatient Hospital Stay: Payer: No Typology Code available for payment source | Admitting: Oncology

## 2024-01-16 ENCOUNTER — Inpatient Hospital Stay: Payer: No Typology Code available for payment source | Attending: Oncology | Admitting: Oncology

## 2024-01-16 VITALS — BP 151/91 | HR 83 | Temp 98.6°F | Resp 18 | Wt 296.5 lb

## 2024-01-16 DIAGNOSIS — N920 Excessive and frequent menstruation with regular cycle: Secondary | ICD-10-CM | POA: Diagnosis not present

## 2024-01-16 DIAGNOSIS — D500 Iron deficiency anemia secondary to blood loss (chronic): Secondary | ICD-10-CM | POA: Diagnosis not present

## 2024-01-16 DIAGNOSIS — D72829 Elevated white blood cell count, unspecified: Secondary | ICD-10-CM | POA: Diagnosis not present

## 2024-01-16 DIAGNOSIS — D72825 Bandemia: Secondary | ICD-10-CM | POA: Diagnosis not present

## 2024-01-16 DIAGNOSIS — D509 Iron deficiency anemia, unspecified: Secondary | ICD-10-CM | POA: Diagnosis present

## 2024-01-16 DIAGNOSIS — Z87440 Personal history of urinary (tract) infections: Secondary | ICD-10-CM | POA: Insufficient documentation

## 2024-01-16 NOTE — Assessment & Plan Note (Signed)
Heavy menstrual bleeding has improved since starting birth control, but still present. -Continue current birth control regimen.

## 2024-01-16 NOTE — Assessment & Plan Note (Addendum)
Leukocytosis improved white blood cell count.  Likely reactive.  Patient also reports frequent UTIs likely contributing to it. 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 -Will do basic rheumatological workup with next blood draw -Continue to monitor white blood cell count

## 2024-01-16 NOTE — Progress Notes (Signed)
[facility] at [facility] HEMATOLOGY FOLLOW-UP VISIT  Alison Sams, DO  REASON FOR FOLLOW-UP: Leukocytosis and iron deficiency anemia   ASSESSMENT & PLAN:  Patient is a 23 year old female with past medical history of menorrhagia presenting for leukocytosis and anemia   IDA (iron deficiency anemia) Iron deficiency anemia likely secondary to menorrhagia.Iron levels have decreased significantly since last check in December. Patient reports dizziness and fatigue. Currently on oral iron supplementation, but recent illness may have affected consistent intake.  Patient is not currently anemic. -Administer IV iron. We discussed some of the risks, benefits, and alternatives of intravenous iron infusions. The patient is symptomatic from anemia and the iron level is critically low. She tolerated oral iron supplement poorly and desires to achieved higher levels of iron faster for adequate hematopoesis. Some of the side-effects to be expected including risks of infusion reactions, phlebitis, headaches, nausea and fatigue.  The patient is willing to proceed. Patient education material was dispensed.  Goal is to keep ferritin level greater than 50 and resolution of anemia -Continue oral iron every other day -Repeat blood work in six weeks to assess response to IV iron  Leukocytosis Leukocytosis improved white blood cell count.  Likely reactive.  Patient also reports frequent UTIs likely contributing to it. 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 -Will do basic rheumatological workup with next blood draw -Continue to monitor white blood cell count  Menorrhagia Heavy menstrual bleeding has improved since starting birth control, but still present. -Continue current birth control regimen.   Orders Placed This Encounter  Procedures   Folate    Standing Status:   Future    Expected Date:   02/20/2024    Expiration Date:   01/15/2025   Ferritin    Standing Status:   Future     Expected Date:   02/20/2024    Expiration Date:   01/15/2025   CBC with Differential/Platelet    Standing Status:   Future    Expected Date:   02/20/2024    Expiration Date:   01/15/2025   Comprehensive metabolic panel    Standing Status:   Future    Expected Date:   02/20/2024    Expiration Date:   01/15/2025   Iron and TIBC    Standing Status:   Future    Expected Date:   02/20/2024    Expiration Date:   01/15/2025    Release to patient:   Immediate [1]   ANA, IFA (with reflex)    Standing Status:   Future    Expected Date:   02/20/2024    Expiration Date:   01/15/2025   Rheumatoid factor    Standing Status:   Future    Expected Date:   02/20/2024    Expiration Date:   01/15/2025    The total time spent in the appointment was 20 minutes encounter with patients including review of chart and various tests results, discussions about plan of care and coordination of care plan   All questions were answered. The patient knows to call the clinic with any problems, questions or concerns. No barriers to learning was detected.  Cindie Crumbly, MD 1/30/20253:57 PM    INTERVAL HISTORY: Alison Bass 23 y.o. female with history of menorrhagia following for leukocytosis and iron deficiency anemia secondary to menorrhagia. She reports feeling dizzy at times and experiencing fatigue and brain fog. She also mentions having been sick recently, which she attributes to food poisoning. The patient is  currently on birth control, which has helped to lighten her menstrual flow compared to the past. She used to have very heavy periods, requiring multiple pads a day, but now reports using fewer pads and not experiencing any clots.  In addition to the iron deficiency, the patient has been experiencing frequent urinary tract infections (UTIs) and has an upcoming appointment with a urologist. She also mentions having hiccups frequently, but does not seem overly concerned about this symptom.  I have reviewed the  past medical history, past surgical history, social history and family history with the patient   ALLERGIES:  is allergic to aloe, cefzil [cefprozil], and dimetapp c [phenylephrine-bromphen-codeine].  MEDICATIONS:  Current Outpatient Medications  Medication Sig Dispense Refill   Adapalene 0.3 % gel Apply a small amount to skin every evening 45 g 3   Clindamycin Phos-Benzoyl Perox gel Apply a small amount to affected area every morning 50 g 3   metFORMIN (GLUCOPHAGE) 500 MG tablet TAKE 1 TABLET BY MOUTH 2 TIMES DAILY WITH A MEAL. 180 tablet 1   norethindrone-ethinyl estradiol-FE (LOESTRIN FE 1/20) 1-20 MG-MCG tablet Take 1 tablet by mouth daily. Start pack on Sunday. 84 tablet 3   spironolactone (ALDACTONE) 25 MG tablet Take 1 tablet (25 mg total) by mouth daily. For acne 90 tablet 3   No current facility-administered medications for this visit.     REVIEW OF SYSTEMS:   Constitutional: Denies fevers, chills or night sweats Eyes: Denies blurriness of vision Ears, nose, mouth, throat, and face: Denies mucositis or sore throat Respiratory: Denies cough, dyspnea or wheezes Cardiovascular: Denies palpitation, chest discomfort or lower extremity swelling Gastrointestinal:  Denies nausea, heartburn or change in bowel habits Skin: Denies abnormal skin rashes Lymphatics: Denies new lymphadenopathy or easy bruising Neurological:Denies numbness, tingling or new weaknesses Behavioral/Psych: Mood is stable, no new changes  All other systems were reviewed with the patient and are negative.  PHYSICAL EXAMINATION:   Vitals:   01/16/24 1400  BP: (!) 151/91  Pulse: 83  Resp: 18  Temp: 98.6 F (37 C)  SpO2: 99%    GENERAL:alert, no distress and comfortable SKIN: skin color, texture, turgor are normal, no rashes or significant lesions LYMPH:  no palpable lymphadenopathy in the cervical, axillary or inguinal LUNGS: clear to auscultation and percussion with normal breathing effort HEART:  regular rate & rhythm and no murmurs and no lower extremity edema ABDOMEN:abdomen soft, non-tender and normal bowel sounds Musculoskeletal:no cyanosis of digits and no clubbing  NEURO: alert & oriented x 3 with fluent speech  LABORATORY DATA:  I have reviewed the data as listed  Lab Results  Component Value Date   WBC 12.2 (H) 11/20/2023   NEUTROABS 9.1 (H) 11/20/2023   HGB 13.6 11/20/2023   HCT 42.3 11/20/2023   MCV 80.6 11/20/2023   PLT 392 11/20/2023      Component Value Date/Time   NA 137 11/20/2023 1533   NA 140 02/08/2023 0956   K 4.0 11/20/2023 1533   CL 102 11/20/2023 1533   CO2 27 11/20/2023 1533   GLUCOSE 98 11/20/2023 1533   BUN 10 11/20/2023 1533   BUN 11 02/08/2023 0956   CREATININE 0.69 11/20/2023 1533   CALCIUM 9.2 11/20/2023 1533   PROT 7.4 11/20/2023 1533   PROT 7.3 02/08/2023 0956   ALBUMIN 3.4 (L) 11/20/2023 1533   ALBUMIN 4.2 02/08/2023 0956   AST 29 11/20/2023 1533   ALT 44 11/20/2023 1533   ALKPHOS 96 11/20/2023 1533   BILITOT 0.3  11/20/2023 1533   BILITOT 0.3 02/08/2023 0956   GFRNONAA >60 11/20/2023 1533       Chemistry      Component Value Date/Time   NA 137 11/20/2023 1533   NA 140 02/08/2023 0956   K 4.0 11/20/2023 1533   CL 102 11/20/2023 1533   CO2 27 11/20/2023 1533   BUN 10 11/20/2023 1533   BUN 11 02/08/2023 0956   CREATININE 0.69 11/20/2023 1533      Component Value Date/Time   CALCIUM 9.2 11/20/2023 1533   ALKPHOS 96 11/20/2023 1533   AST 29 11/20/2023 1533   ALT 44 11/20/2023 1533   BILITOT 0.3 11/20/2023 1533   BILITOT 0.3 02/08/2023 0956      Latest Reference Range & Units 04/12/23 16:14 09/20/23 13:53 11/20/23 15:33  Iron 28 - 170 ug/dL 16 (L) 44 37  UIBC ug/dL 295 621 308  TIBC 657 - 450 ug/dL 846 962 952 (H)  Saturation Ratios 10.4 - 31.8 %   8 (L)  Ferritin 11 - 307 ng/mL 13 (L) 69 46  Iron Saturation 15 - 55 % 4 (LL) 13 (L)   Folate >5.9 ng/mL   10.4  CRP <1.0 mg/dL   3.0 (H)  Vitamin W41 180 - 914 pg/mL    333  (LL): Data is critically low (L): Data is abnormally low (H): Data is abnormally high  Latest Reference Range & Units 11/20/23 15:33  Sed Rate 0 - 22 mm/hr 17   Flow cytometry: 11/20/2023: DIAGNOSIS:   -Slight increase in gamma delta T cells.  -No monoclonal B-cell population identified  -See comment   COMMENT:   Flow cytometric analysis of the lymphoid population shows predominance  of T cells (13% of all white blood cells) displaying expression of pan  T-cell antigens with normal CD4:CD8 ratio.  However, there is slight  increase in the proportion of T cells with gamma delta phenotype (11% of  T cells).  B cells represent a minor population with no monoclonality.  The T-cell changes are limited and likely represent secondary changes in  most settings due infection, connective tissue disease, immune  suppression/dysregulation.  Clinical correlation and follow-up are  recommended.

## 2024-01-16 NOTE — Patient Instructions (Signed)
VISIT SUMMARY:  During today's visit, we discussed your ongoing issues with iron deficiency anemia, heavy menstrual bleeding, and frequent urinary tract infections. We also reviewed your recent symptoms and planned further evaluations to address your health concerns.  YOUR PLAN:  -IRON DEFICIENCY ANEMIA: Iron deficiency anemia occurs when your body doesn't have enough iron to produce adequate levels of hemoglobin, which can lead to symptoms like dizziness and fatigue. We will administer IV iron in two doses, one week apart, and repeat blood work in six weeks to see how you respond to the treatment.  -MENORRHAGIA: Menorrhagia is heavy menstrual bleeding. Your bleeding has improved with birth control, so we will continue your current regimen.  -POSSIBLE AUTOIMMUNE DISORDER: An autoimmune disorder occurs when the immune system mistakenly attacks the body. Abnormal T cells in your blood work suggest possible inflammation. We will order an ANA panel during your next blood draw to screen for autoimmune diseases.  -FREQUENT URINARY TRACT INFECTIONS: Urinary tract infections (UTIs) are infections in any part of the urinary system. You have an upcoming appointment with a urologist to further investigate this issue, so please continue with that appointment.  -GENERAL HEALTH MAINTENANCE: We will continue to monitor for symptoms of autoimmune disease and follow up in a few weeks after your IV iron administration.  INSTRUCTIONS:  Please follow up in a few weeks after your IV iron administration. Additionally, continue with your urologist appointment as planned.

## 2024-01-16 NOTE — Assessment & Plan Note (Signed)
Iron deficiency anemia likely secondary to menorrhagia.Iron levels have decreased significantly since last check in December. Patient reports dizziness and fatigue. Currently on oral iron supplementation, but recent illness may have affected consistent intake.  Patient is not currently anemic. -Administer IV iron. We discussed some of the risks, benefits, and alternatives of intravenous iron infusions. The patient is symptomatic from anemia and the iron level is critically low. She tolerated oral iron supplement poorly and desires to achieved higher levels of iron faster for adequate hematopoesis. Some of the side-effects to be expected including risks of infusion reactions, phlebitis, headaches, nausea and fatigue.  The patient is willing to proceed. Patient education material was dispensed.  Goal is to keep ferritin level greater than 50 and resolution of anemia -Continue oral iron every other day -Repeat blood work in six weeks to assess response to IV iron

## 2024-01-21 ENCOUNTER — Ambulatory Visit (INDEPENDENT_AMBULATORY_CARE_PROVIDER_SITE_OTHER): Payer: No Typology Code available for payment source | Admitting: Otolaryngology

## 2024-01-21 VITALS — BP 133/85 | HR 86 | Ht 67.0 in | Wt 293.0 lb

## 2024-01-21 DIAGNOSIS — J0391 Acute recurrent tonsillitis, unspecified: Secondary | ICD-10-CM

## 2024-01-21 NOTE — Progress Notes (Signed)
 Dear Dr. Mancil, Here is my assessment for our mutual patient, Alison Bass. Thank you for allowing me the opportunity to care for your patient. Please do not hesitate to contact me should you have any other questions. Sincerely, Dr. Eldora Blanch  Otolaryngology Clinic Note Referring provider: Dr. Mancil HPI:  Alison Bass kindly referred by Dr. Mancil for evaluation of sore throat and enlarged tonsils.  Patient reports: reports she had tonsillitis in late December (sore throat, enlarged tonsils). Given prednisone  and amoxicillin . She reports that she felt better, then got worse, given abx again and then seen here. She reports that she is back to normal. No sore throat, dysphagia, odynophagia, aspiration episodes or PNA, unintentional weight loss. No B symptoms. - She also denies changes in voice, shortness of breath, hemoptysis, ear pain, neck masses.  No problems with tonsilloliths.   She reports that she has known that she has big tonsils and did get recurrent infections in high school, but has not occurred for past few years. No prior PTAs.   H&N Surgery: no Personal or FHx of bleeding dz or anesthesia difficulty: no  GLP-1: no AP/AC: no  Tobacco: no. Lives in [place], [name]  PMHx: Iron Deficiency Anemia, Leukocytosis (likely reactive), AR, GERD  Independent Review of Additional Tests or Records:  Charmaine Grooms, PA-C ([facility]) - 12/17/2023 - Noted sore throat and enlarged tonsils; no infection, sent for cx; sore throat x3 weeks; prior finished pred and amoxicillin ; Dx: Chronic tonsillitis; Rx: Ref ENT Dr. Waddell ([facility]) 02/17/2021 ([facility]) - sore throat; Dx: Tonsillitis; Rx: Augmentin , Ref ENT Rapid strep tests reviewed -- all negative  GAS culture (12/17/2023): negative CBC 11/20/2023: WBC 12.2, Hgb 13.6 CMP 11/20/2023: wnl except Albumin 3.4  11/20/2023 Sed Rate, CRP: wnl except CRP 3 PMH/Meds/All/SocHx/FamHx/ROS:   Past Medical History:  Diagnosis Date    Allergy    GERD (gastroesophageal reflux disease)    Plagiocephaly      No past surgical history on file.  Family History  Problem Relation Age of Onset   Diabetes Mother    Heart disease Paternal Grandfather        Died at 38     Social Connections: Socially Isolated (09/24/2023)   Social Connection and Isolation Panel [NHANES]    Frequency of Communication with Friends and Family: More than three times a week    Frequency of Social Gatherings with Friends and Family: More than three times a week    Attends Religious Services: Never    Database Administrator or Organizations: No    Attends Engineer, Structural: Not on file    Marital Status: Never married      Current Outpatient Medications:    Adapalene  0.3 % gel, Apply a small amount to skin every evening, Disp: 45 g, Rfl: 3   Clindamycin  Phos-Benzoyl Perox gel, Apply a small amount to affected area every morning, Disp: 50 g, Rfl: 3   norethindrone-ethinyl estradiol -FE (LOESTRIN FE 1/20) 1-20 MG-MCG tablet, Take 1 tablet by mouth daily. Start pack on Sunday., Disp: 84 tablet, Rfl: 3   spironolactone  (ALDACTONE ) 25 MG tablet, Take 1 tablet (25 mg total) by mouth daily. For acne, Disp: 90 tablet, Rfl: 3   metFORMIN  (GLUCOPHAGE ) 500 MG tablet, TAKE 1 TABLET BY MOUTH 2 TIMES DAILY WITH A MEAL. (Patient not taking: Reported on 01/21/2024), Disp: 180 tablet, Rfl: 1   Physical Exam:   BP 133/85 (BP Location: Left Arm, Patient Position: Sitting, Cuff Size: Normal)  Pulse 86   Ht 5' 7 (1.702 m)   Wt 293 lb (132.9 kg)   SpO2 98%   BMI 45.89 kg/m   Salient findings:  CN II-XII intact  Bilateral EAC clear and TM intact with well pneumatized middle ear spaces Anterior rhinoscopy: Septum relatively midline; bilateral inferior turbinates without significant hypertrophy No lesions of oral cavity/oropharynx; tonsils 2+/2+ normal in appearance, no erythema or signs of infection No obviously palpable neck  masses/lymphadenopathy/thyromegaly No respiratory distress or stridor  Seprately Identifiable Procedures:  None  Impression & Plans:  Alison Bass is a 23 y.o. Bass with:  1. Recurrent tonsillitis    Had perhaps two episodes (Strep negative) in December and now back to normal. Denies frequent tonsil infections.  As such, does not meet criteria for tonsillectomy F/u PRN; advised good oral hygiene  See below regarding exact medications prescribed this encounter including dosages and route: No orders of the defined types were placed in this encounter.     Thank you for allowing me the opportunity to care for your patient. Please do not hesitate to contact me should you have any other questions.  Sincerely, Eldora Blanch, MD Otolaryngologist (ENT), [facility] Phone: [phone] Fax: [phone]  01/21/2024, 1:52 PM   MDM:  Level 3 Complexity/Problems addressed: low Data complexity: mod - independent review of notes, labs, tests - Morbidity: low  - Prescription Drug prescribed or managed: no

## 2024-01-23 ENCOUNTER — Inpatient Hospital Stay: Payer: No Typology Code available for payment source | Attending: Hematology

## 2024-01-23 VITALS — BP 115/67 | HR 72 | Temp 97.0°F | Resp 18

## 2024-01-23 DIAGNOSIS — D72829 Elevated white blood cell count, unspecified: Secondary | ICD-10-CM | POA: Diagnosis not present

## 2024-01-23 DIAGNOSIS — D509 Iron deficiency anemia, unspecified: Secondary | ICD-10-CM | POA: Insufficient documentation

## 2024-01-23 DIAGNOSIS — D500 Iron deficiency anemia secondary to blood loss (chronic): Secondary | ICD-10-CM

## 2024-01-23 DIAGNOSIS — N920 Excessive and frequent menstruation with regular cycle: Secondary | ICD-10-CM | POA: Insufficient documentation

## 2024-01-23 MED ORDER — SODIUM CHLORIDE 0.9 % IV SOLN
510.0000 mg | Freq: Once | INTRAVENOUS | Status: AC
  Administered 2024-01-23: 510 mg via INTRAVENOUS
  Filled 2024-01-23: qty 510

## 2024-01-23 MED ORDER — CETIRIZINE HCL 10 MG PO TABS
10.0000 mg | ORAL_TABLET | Freq: Once | ORAL | Status: AC
  Administered 2024-01-23: 10 mg via ORAL
  Filled 2024-01-23: qty 1

## 2024-01-23 MED ORDER — SODIUM CHLORIDE 0.9 % IV SOLN: INTRAVENOUS | Status: DC

## 2024-01-23 MED ORDER — ACETAMINOPHEN 325 MG PO TABS
650.0000 mg | ORAL_TABLET | Freq: Once | ORAL | Status: AC
  Administered 2024-01-23: 650 mg via ORAL
  Filled 2024-01-23: qty 2

## 2024-01-23 MED ORDER — METHYLPREDNISOLONE SODIUM SUCC 125 MG IJ SOLR
125.0000 mg | Freq: Once | INTRAMUSCULAR | Status: AC | PRN
  Administered 2024-01-23: 125 mg via INTRAVENOUS
  Filled 2024-01-23: qty 2

## 2024-01-23 NOTE — Patient Instructions (Signed)
 [facility] [place] - A DEPT OF [facility]  Discharge Instructions: Thank you for choosing [facility] to provide your oncology and hematology care.  If you have a lab appointment with the Cancer Center - please note that after April 8th, 2024, all labs will be drawn in the cancer center.  You do not have to check in or register with the main entrance as you have in the past but will complete your check-in in the cancer center.  Wear comfortable clothing and clothing appropriate for easy access to any Portacath or PICC line.   We strive to give you quality time with your provider. You may need to reschedule your appointment if you arrive late (15 or more minutes).  Arriving late affects you and other patients whose appointments are after yours.  Also, if you miss three or more appointments without notifying the office, you may be dismissed from the clinic at the provider's discretion.      For prescription refill requests, have your pharmacy contact our office and allow 72 hours for refills to be completed.    Today you received the following chemotherapy and/or immunotherapy agents Feraheme. Ferumoxytol Injection What is this medication? FERUMOXYTOL (FER ue MOX i tol) treats low levels of iron in your body (iron deficiency anemia). Iron is a mineral that plays an important role in making red blood cells, which carry oxygen from your lungs to the rest of your body. This medicine may be used for other purposes; ask your health care provider or pharmacist if you have questions. COMMON BRAND NAME(S): Feraheme What should I tell my care team before I take this medication? They need to know if you have any of these conditions: Anemia not caused by low iron levels High levels of iron in the blood Magnetic resonance imaging (MRI) test scheduled An unusual or allergic reaction to iron, other medications, foods, dyes, or preservatives Pregnant or trying to get  pregnant Breastfeeding How should I use this medication? This medication is injected into a vein. It is given by your care team in a hospital or clinic setting. Talk to your care team the use of this medication in children. Special care may be needed. Overdosage: If you think you have taken too much of this medicine contact a poison control center or emergency room at once. NOTE: This medicine is only for you. Do not share this medicine with others. What if I miss a dose? It is important not to miss your dose. Call your care team if you are unable to keep an appointment. What may interact with this medication? Other iron products This list may not describe all possible interactions. Give your health care provider a list of all the medicines, herbs, non-prescription drugs, or dietary supplements you use. Also tell them if you smoke, drink alcohol, or use illegal drugs. Some items may interact with your medicine. What should I watch for while using this medication? Visit your care team for regular checks on your progress. Tell your care team if your symptoms do not start to get better or if they get worse. You may need blood work done while you are taking this medication. You may need to eat more foods that contain iron. Talk to your care team. Foods that contain iron include whole grains or cereals, dried fruits, beans, peas, leafy green vegetables, and organ meats (liver, kidney). What side effects may I notice from receiving this medication? Side effects that  you should report to your care team as soon as possible: Allergic reactions--skin rash, itching, hives, swelling of the face, lips, tongue, or throat Low blood pressure--dizziness, feeling faint or lightheaded, blurry vision Shortness of breath Side effects that usually do not require medical attention (report to your care team if they continue or are bothersome): Flushing Headache Joint pain Muscle pain Nausea Pain, redness, or  irritation at injection site This list may not describe all possible side effects. Call your doctor for medical advice about side effects. You may report side effects to FDA at 1-800-FDA-1088. Where should I keep my medication? This medication is given in a hospital or clinic. It will not be stored at home. NOTE: This sheet is a summary. It may not cover all possible information. If you have questions about this medicine, talk to your doctor, pharmacist, or health care provider.  2024 Elsevier/Gold Standard (2023-07-24 00:00:00)      To help prevent nausea and vomiting after your treatment, we encourage you to take your nausea medication as directed.  BELOW ARE SYMPTOMS THAT SHOULD BE REPORTED IMMEDIATELY: *FEVER GREATER THAN 100.4 F (38 C) OR HIGHER *CHILLS OR SWEATING *NAUSEA AND VOMITING THAT IS NOT CONTROLLED WITH YOUR NAUSEA MEDICATION *UNUSUAL SHORTNESS OF BREATH *UNUSUAL BRUISING OR BLEEDING *URINARY PROBLEMS (pain or burning when urinating, or frequent urination) *BOWEL PROBLEMS (unusual diarrhea, constipation, pain near the anus) TENDERNESS IN MOUTH AND THROAT WITH OR WITHOUT PRESENCE OF ULCERS (sore throat, sores in mouth, or a toothache) UNUSUAL RASH, SWELLING OR PAIN  UNUSUAL VAGINAL DISCHARGE OR ITCHING   Items with * indicate a potential emergency and should be followed up as soon as possible or go to the Emergency Department if any problems should occur.  Please show the CHEMOTHERAPY ALERT CARD or IMMUNOTHERAPY ALERT CARD at check-in to the Emergency Department and triage nurse.  Should you have questions after your visit or need to cancel or reschedule your appointment, please contact [facility] [place] - A DEPT OF Eligha Bridegroom [facility] [phone]  and follow the prompts.  Office hours are 8:00 a.m. to 4:30 p.m. Monday - Friday. Please note that voicemails left after 4:00 p.m. may not be returned until the following business day.  We are closed weekends  and major holidays. You have access to a nurse at all times for urgent questions. Please call the main number to the clinic [phone] and follow the prompts.  For any non-urgent questions, you may also contact your provider using MyChart. We now offer e-Visits for anyone 67 and older to request care online for non-urgent symptoms. For details visit [email or web address].   Also download the MyChart app! Go to the app store, search "MyChart", open the app, select [place], and log in with your MyChart username and password.

## 2024-01-23 NOTE — Progress Notes (Signed)
 Patient presents today for 1st Feraheme  infusion. Heart rate on arrival 122. Blood pressure 145/90.   Heart rate recheck 94.   15:19 pm Patient called out with dizziness, Feraheme  stopped. 250 ml bolus of normal saline started. Vital signs 91/66, 99%, 69 heart rate, respirations 19.   15:21 pm. 106/51, 99%, heart rate 70.  15:23 pm . Patient complains of abdominal pain, feeling flush and dizzy.   15:30 pm Dr. Rogers at the bedside. Assessment performed. Patient denies any itching, shortness of breath, facial swelling or pain. 15:32 pm. Verbal orders received to give Solumedrol 125 mg IV push now.   15:35 pm Solumedrol given.   15:35 108/71, 99%, 17, 78 heart rate.   15:38 pm 117/71, 99%, 78 heart rate.   15:50 pm Feraheme  restarted per MD orders through secure chat.  Hypersensitivity Reaction note  Date of event: 01/23/24 Time of event: 15:19 pm Generic name of drug involved: Ferumoxytol  Name of provider notified of the hypersensitivity reaction: Dr. Katragadda . Was agent that likely caused hypersensitivity reaction added to Allergies List within EMR? Yes  Chain of events including reaction signs/symptoms, treatment administered, and outcome (e.g., drug resumed; drug discontinued; sent to Emergency Department; etc.) Solumedrol 125 mg given IV push. 250 ml bolus of normal saline given.   Leotis JONETTA Nasuti, RN 01/23/2024 4:20 PM Feraheme  restarted at 15:50 pm per MD orders. Tolerated infusion without adverse affects. Vital signs stable. No complaints at this time. Discharged from clinic ambulatory in stable condition. Alert and oriented x 3. F/U with [facility] as scheduled.

## 2024-01-24 ENCOUNTER — Other Ambulatory Visit: Payer: Self-pay | Admitting: Oncology

## 2024-01-29 ENCOUNTER — Ambulatory Visit (INDEPENDENT_AMBULATORY_CARE_PROVIDER_SITE_OTHER): Payer: No Typology Code available for payment source | Admitting: Urology

## 2024-01-29 DIAGNOSIS — Z87440 Personal history of urinary (tract) infections: Secondary | ICD-10-CM

## 2024-01-29 DIAGNOSIS — N342 Other urethritis: Secondary | ICD-10-CM

## 2024-01-29 DIAGNOSIS — N390 Urinary tract infection, site not specified: Secondary | ICD-10-CM

## 2024-01-29 DIAGNOSIS — R102 Pelvic and perineal pain: Secondary | ICD-10-CM | POA: Diagnosis not present

## 2024-01-29 DIAGNOSIS — R300 Dysuria: Secondary | ICD-10-CM

## 2024-01-29 LAB — MICROSCOPIC EXAMINATION

## 2024-01-29 LAB — URINALYSIS, ROUTINE W REFLEX MICROSCOPIC
Bilirubin, UA: NEGATIVE
Glucose, UA: NEGATIVE
Ketones, UA: NEGATIVE
Nitrite, UA: NEGATIVE
Protein,UA: NEGATIVE
RBC, UA: NEGATIVE
Specific Gravity, UA: 1.03 (ref 1.005–1.030)
Urobilinogen, Ur: 0.2 mg/dL (ref 0.2–1.0)
pH, UA: 6 (ref 5.0–7.5)

## 2024-01-29 MED ORDER — DOXYCYCLINE HYCLATE 100 MG PO CAPS: 100.0000 mg | ORAL_CAPSULE | Freq: Two times a day (BID) | ORAL | 0 refills | Status: DC

## 2024-01-29 NOTE — Patient Instructions (Signed)

## 2024-01-29 NOTE — Progress Notes (Signed)
Bladder Scan completed today.  Patient can void prior to the bladder scan. Bladder scan result: 14

## 2024-01-29 NOTE — Progress Notes (Signed)
01/29/2024 9:09 AM   Alison Bass December 06, 2001 [id number]  Referring provider: Campbell Riches, NP [address] [place],  [name] [address]  Pelvic pain   HPI: Alison Bass is a Alison Bass here for evaluation of pelvic pain and recurrent UTI. For the past year she had has been treated for 4 UTIs in the past year and gets the sensation of a UTI every 1-2 weeks. She gets urinary urgency/frequency. She has been treated with macrobid and the symptoms only partially improve.    PMH: Past Medical History:  Diagnosis Date   Allergy    GERD (gastroesophageal reflux disease)    Plagiocephaly     Surgical History: No past surgical history on file.  Home Medications:  Allergies as of 01/29/2024       Reactions   Aloe    redness   Cefzil [cefprozil]    somach upset   Dimetapp C [phenylephrine-bromphen-codeine]    fussiness   Ferumoxytol Other (See Comments)   Drop in blood pressure, dizziness, abdominal cramping.         Medication List        Accurate as of January 29, 2024  9:09 AM. If you have any questions, ask your nurse or doctor.          Adapalene 0.3 % gel Apply a small amount to skin every evening   Clindamycin Phos-Benzoyl Perox gel Apply a small amount to affected area every morning   metFORMIN 500 MG tablet Commonly known as: GLUCOPHAGE TAKE 1 TABLET BY MOUTH 2 TIMES DAILY WITH A MEAL.   norethindrone-ethinyl estradiol-FE 1-20 MG-MCG tablet Commonly known as: Loestrin Fe 1/20 Take 1 tablet by mouth daily. Start pack on Sunday.   spironolactone 25 MG tablet Commonly known as: Aldactone Take 1 tablet (25 mg total) by mouth daily. For acne        Allergies:  Allergies  Allergen Reactions   Aloe     redness   Cefzil [Cefprozil]     somach upset   Dimetapp C [Phenylephrine-Bromphen-Codeine]     fussiness   Ferumoxytol Other (See Comments)    Drop in blood pressure, dizziness, abdominal cramping.     Family  History: Family History  Problem Relation Age of Onset   Diabetes Mother    Heart disease Paternal Grandfather        Died at 48    Social History:  reports that she has never smoked. She has never used smokeless tobacco. She reports that she does not drink alcohol and does not use drugs.  ROS: All other review of systems were reviewed and are negative except what is noted above in HPI  Physical Exam: BP 127/85   Pulse 83   Constitutional:  Alert and oriented, No acute distress. HEENT: [place] AT, moist mucus membranes.  Trachea midline, no masses. Cardiovascular: No clubbing, cyanosis, or edema. Respiratory: Normal respiratory effort, no increased work of breathing. GI: Abdomen is soft, nontender, nondistended, no abdominal masses GU: No CVA tenderness.  Lymph: No cervical or inguinal lymphadenopathy. Skin: No rashes, bruises or suspicious lesions. Neurologic: Grossly intact, no focal deficits, moving all 4 extremities. Psychiatric: Normal mood and affect.  Laboratory Data: Lab Results  Component Value Date   WBC 12.2 (H) 11/20/2023   HGB 13.6 11/20/2023   HCT 42.3 11/20/2023   MCV 80.6 11/20/2023   PLT 392 11/20/2023    Lab Results  Component Value Date   CREATININE 0.69 11/20/2023    No  results found for: "PSA"  No results found for: "TESTOSTERONE"  No results found for: "HGBA1C"  Urinalysis    Component Value Date/Time   BILIRUBINUR negative 10/23/2023 1607   KETONESUR negative 10/23/2023 1607   PROTEINUR negative 10/03/2022 1917   PROTEINUR trace 05/14/2014 1321   UROBILINOGEN 0.2 10/23/2023 1607   NITRITE Negative 10/23/2023 1607   LEUKOCYTESUR Trace (A) 10/23/2023 1607    No results found for: "LABMICR", "WBCUA", "RBCUA", "LABEPIT", "MUCUS", "BACTERIA"  Pertinent Imaging: *** No results found for this or any previous visit.  No results found for this or any previous visit.  No results found for this or any previous visit.  No results found for  this or any previous visit.  No results found for this or any previous visit.  No results found for this or any previous visit.  No results found for this or any previous visit.  No results found for this or any previous visit.   Assessment & Plan:    1.  Recurrent UTI Urine for culture Doxycycline 100mg  BID for 7 days - Urinalysis, Routine w reflex microscopic - BLADDER SCAN AMB NON-IMAGING   No follow-ups on file.  Alison Aye, MD  [facility] [place]

## 2024-01-30 ENCOUNTER — Inpatient Hospital Stay: Payer: No Typology Code available for payment source

## 2024-01-30 VITALS — BP 129/74 | HR 88 | Temp 97.7°F | Resp 20

## 2024-01-30 DIAGNOSIS — D509 Iron deficiency anemia, unspecified: Secondary | ICD-10-CM | POA: Diagnosis not present

## 2024-01-30 DIAGNOSIS — D500 Iron deficiency anemia secondary to blood loss (chronic): Secondary | ICD-10-CM

## 2024-01-30 MED ORDER — KETOROLAC TROMETHAMINE 60 MG/2ML IM SOLN
60.0000 mg | Freq: Once | INTRAMUSCULAR | Status: DC
Start: 2024-01-30 — End: 2024-01-30
  Filled 2024-01-30: qty 2

## 2024-01-30 MED ORDER — FERUMOXYTOL INJECTION 510 MG/17 ML
510.0000 mg | Freq: Once | INTRAVENOUS | Status: AC
  Administered 2024-01-30: 510 mg via INTRAVENOUS
  Filled 2024-01-30: qty 510

## 2024-01-30 MED ORDER — CETIRIZINE HCL 10 MG PO TABS
10.0000 mg | ORAL_TABLET | Freq: Once | ORAL | Status: AC
  Administered 2024-01-30: 10 mg via ORAL
  Filled 2024-01-30: qty 1

## 2024-01-30 MED ORDER — FAMOTIDINE IN NACL 20-0.9 MG/50ML-% IV SOLN
20.0000 mg | Freq: Once | INTRAVENOUS | Status: AC
  Administered 2024-01-30: 20 mg via INTRAVENOUS
  Filled 2024-01-30: qty 50

## 2024-01-30 MED ORDER — ACETAMINOPHEN 325 MG PO TABS
650.0000 mg | ORAL_TABLET | Freq: Once | ORAL | Status: AC
  Administered 2024-01-30: 650 mg via ORAL
  Filled 2024-01-30: qty 2

## 2024-01-30 MED ORDER — SODIUM CHLORIDE 0.9 % IV SOLN: INTRAVENOUS | Status: DC

## 2024-01-30 MED ORDER — METHYLPREDNISOLONE SODIUM SUCC 125 MG IJ SOLR
125.0000 mg | Freq: Once | INTRAMUSCULAR | Status: AC
  Administered 2024-01-30: 125 mg via INTRAVENOUS
  Filled 2024-01-30: qty 2

## 2024-01-30 NOTE — Patient Instructions (Signed)
[facility] [place] - [facility]  Discharge Instructions: Thank you for choosing [facility] to provide your oncology and hematology care.  If you have a lab appointment with the Cancer Center - please note that after April 8th, 2024, all labs will be drawn in the cancer center.  You do not have to check in or register with the main entrance as you have in the past but will complete your check-in in the cancer center.  Wear comfortable clothing and clothing appropriate for easy access to any Portacath or PICC line.   We strive to give you quality time with your provider. You may need to reschedule your appointment if you arrive late (15 or more minutes).  Arriving late affects you and other patients whose appointments are after yours.  Also, if you miss three or more appointments without notifying the office, you may be dismissed from the clinic at the provider's discretion.      For prescription refill requests, have your pharmacy contact our office and allow 72 hours for refills to be completed.    Today you received the following chemotherapy and/or immunotherapy agents feraheme. Ferumoxytol Injection What is this medication? FERUMOXYTOL (FER ue MOX i tol) treats low levels of iron in your body (iron deficiency anemia). Iron is a mineral that plays an important role in making red blood cells, which carry oxygen from your lungs to the rest of your body. This medicine may be used for other purposes; ask your health care provider or pharmacist if you have questions. COMMON BRAND NAME(S): Feraheme What should I tell my care team before I take this medication? They need to know if you have any of these conditions: Anemia not caused by low iron levels High levels of iron in the blood Magnetic resonance imaging (MRI) test scheduled An unusual or allergic reaction to iron, other medications, foods, dyes, or preservatives Pregnant or trying to get  pregnant Breastfeeding How should I use this medication? This medication is injected into a vein. It is given by your care team in a hospital or clinic setting. Talk to your care team the use of this medication in children. Special care may be needed. Overdosage: If you think you have taken too much of this medicine contact a poison control center or emergency room at once. NOTE: This medicine is only for you. Do not share this medicine with others. What if I miss a dose? It is important not to miss your dose. Call your care team if you are unable to keep an appointment. What may interact with this medication? Other iron products This list may not describe all possible interactions. Give your health care provider a list of all the medicines, herbs, non-prescription drugs, or dietary supplements you use. Also tell them if you smoke, drink alcohol, or use illegal drugs. Some items may interact with your medicine. What should I watch for while using this medication? Visit your care team for regular checks on your progress. Tell your care team if your symptoms do not start to get better or if they get worse. You may need blood work done while you are taking this medication. You may need to eat more foods that contain iron. Talk to your care team. Foods that contain iron include whole grains or cereals, dried fruits, beans, peas, leafy green vegetables, and organ meats (liver, kidney). What side effects may I notice from receiving this medication? Side effects that  you should report to your care team as soon as possible: Allergic reactions--skin rash, itching, hives, swelling of the face, lips, tongue, or throat Low blood pressure--dizziness, feeling faint or lightheaded, blurry vision Shortness of breath Side effects that usually do not require medical attention (report to your care team if they continue or are bothersome): Flushing Headache Joint pain Muscle pain Nausea Pain, redness, or  irritation at injection site This list may not describe all possible side effects. Call your doctor for medical advice about side effects. You may report side effects to FDA at 1-800-FDA-1088. Where should I keep my medication? This medication is given in a hospital or clinic. It will not be stored at home. NOTE: This sheet is a summary. It may not cover all possible information. If you have questions about this medicine, talk to your doctor, pharmacist, or health care provider.  2024 Elsevier/Gold Standard (2023-07-24 00:00:00)      To help prevent nausea and vomiting after your treatment, we encourage you to take your nausea medication as directed.  BELOW ARE SYMPTOMS THAT SHOULD BE REPORTED IMMEDIATELY: *FEVER GREATER THAN 100.4 F (38 C) OR HIGHER *CHILLS OR SWEATING *NAUSEA AND VOMITING THAT IS NOT CONTROLLED WITH YOUR NAUSEA MEDICATION *UNUSUAL SHORTNESS OF BREATH *UNUSUAL BRUISING OR BLEEDING *URINARY PROBLEMS (pain or burning when urinating, or frequent urination) *BOWEL PROBLEMS (unusual diarrhea, constipation, pain near the anus) TENDERNESS IN MOUTH AND THROAT WITH OR WITHOUT PRESENCE OF ULCERS (sore throat, sores in mouth, or a toothache) UNUSUAL RASH, SWELLING OR PAIN  UNUSUAL VAGINAL DISCHARGE OR ITCHING   Items with * indicate a potential emergency and should be followed up as soon as possible or go to the Emergency Department if any problems should occur.  Please show the CHEMOTHERAPY ALERT CARD or IMMUNOTHERAPY ALERT CARD at check-in to the Emergency Department and triage nurse.  Should you have questions after your visit or need to cancel or reschedule your appointment, please contact [facility] [place] - A DEPT OF Eligha Bridegroom [facility] [phone]  and follow the prompts.  Office hours are 8:00 a.m. to 4:30 p.m. Monday - Friday. Please note that voicemails left after 4:00 p.m. may not be returned until the following business day.  We are closed weekends  and major holidays. You have access to a nurse at all times for urgent questions. Please call the main number to the clinic [phone] and follow the prompts.  For any non-urgent questions, you may also contact your provider using MyChart. We now offer e-Visits for anyone 38 and older to request care online for non-urgent symptoms. For details visit [email or web address].   Also download the MyChart app! Go to the app store, search "MyChart", open the app, select [place], and log in with your MyChart username and password.

## 2024-01-30 NOTE — Progress Notes (Signed)
Treatment given today per MD orders. Tolerated infusion without adverse affects. Vital signs stable. No complaints at this time. Discharged from clinic ambulatory in stable condition. Alert and oriented x 3. F/U with [facility] as scheduled.

## 2024-02-11 ENCOUNTER — Encounter: Payer: Self-pay | Admitting: Urology

## 2024-02-20 ENCOUNTER — Inpatient Hospital Stay: Payer: No Typology Code available for payment source | Attending: Oncology

## 2024-02-20 DIAGNOSIS — D509 Iron deficiency anemia, unspecified: Secondary | ICD-10-CM | POA: Insufficient documentation

## 2024-02-20 DIAGNOSIS — D72829 Elevated white blood cell count, unspecified: Secondary | ICD-10-CM | POA: Insufficient documentation

## 2024-02-20 DIAGNOSIS — N390 Urinary tract infection, site not specified: Secondary | ICD-10-CM | POA: Insufficient documentation

## 2024-02-20 DIAGNOSIS — N920 Excessive and frequent menstruation with regular cycle: Secondary | ICD-10-CM | POA: Insufficient documentation

## 2024-02-20 DIAGNOSIS — E538 Deficiency of other specified B group vitamins: Secondary | ICD-10-CM | POA: Diagnosis not present

## 2024-02-20 DIAGNOSIS — D75839 Thrombocytosis, unspecified: Secondary | ICD-10-CM | POA: Diagnosis not present

## 2024-02-20 DIAGNOSIS — D500 Iron deficiency anemia secondary to blood loss (chronic): Secondary | ICD-10-CM

## 2024-02-20 LAB — CBC WITH DIFFERENTIAL/PLATELET
Abs Immature Granulocytes: 0.04 10*3/uL (ref 0.00–0.07)
Basophils Absolute: 0.1 10*3/uL (ref 0.0–0.1)
Basophils Relative: 1 %
Eosinophils Absolute: 0.2 10*3/uL (ref 0.0–0.5)
Eosinophils Relative: 1 %
HCT: 42.9 % (ref 36.0–46.0)
Hemoglobin: 13.7 g/dL (ref 12.0–15.0)
Immature Granulocytes: 0 %
Lymphocytes Relative: 21 %
Lymphs Abs: 2.4 10*3/uL (ref 0.7–4.0)
MCH: 26.9 pg (ref 26.0–34.0)
MCHC: 31.9 g/dL (ref 30.0–36.0)
MCV: 84.3 fL (ref 80.0–100.0)
Monocytes Absolute: 0.6 10*3/uL (ref 0.1–1.0)
Monocytes Relative: 5 %
Neutro Abs: 8.3 10*3/uL — ABNORMAL HIGH (ref 1.7–7.7)
Neutrophils Relative %: 72 %
Platelets: 408 10*3/uL — ABNORMAL HIGH (ref 150–400)
RBC: 5.09 MIL/uL (ref 3.87–5.11)
RDW: 15.9 % — ABNORMAL HIGH (ref 11.5–15.5)
WBC: 11.5 10*3/uL — ABNORMAL HIGH (ref 4.0–10.5)
nRBC: 0 % (ref 0.0–0.2)

## 2024-02-20 LAB — COMPREHENSIVE METABOLIC PANEL
ALT: 31 U/L (ref 0–44)
AST: 18 U/L (ref 15–41)
Albumin: 3.4 g/dL — ABNORMAL LOW (ref 3.5–5.0)
Alkaline Phosphatase: 86 U/L (ref 38–126)
Anion gap: 10 (ref 5–15)
BUN: 11 mg/dL (ref 6–20)
CO2: 25 mmol/L (ref 22–32)
Calcium: 9.2 mg/dL (ref 8.9–10.3)
Chloride: 104 mmol/L (ref 98–111)
Creatinine, Ser: 1.02 mg/dL — ABNORMAL HIGH (ref 0.44–1.00)
GFR, Estimated: >60 mL/min (ref >60)
Glucose, Bld: 88 mg/dL (ref 70–99)
Potassium: 3.9 mmol/L (ref 3.5–5.1)
Sodium: 139 mmol/L (ref 135–145)
Total Bilirubin: 0.4 mg/dL (ref 0.0–1.2)
Total Protein: 7.6 g/dL (ref 6.5–8.1)

## 2024-02-20 LAB — IRON AND TIBC
Iron: 56 ug/dL (ref 28–170)
Saturation Ratios: 16 % (ref 10.4–31.8)
TIBC: 354 ug/dL (ref 250–450)
UIBC: 298 ug/dL

## 2024-02-20 LAB — FERRITIN: Ferritin: 357 ng/mL — ABNORMAL HIGH (ref 11–307)

## 2024-02-20 LAB — FOLATE: Folate: 8.3 ng/mL (ref >5.9)

## 2024-02-21 LAB — RHEUMATOID FACTOR: Rheumatoid fact SerPl-aCnc: <10 [IU]/mL (ref <14.0)

## 2024-02-27 ENCOUNTER — Inpatient Hospital Stay (HOSPITAL_BASED_OUTPATIENT_CLINIC_OR_DEPARTMENT_OTHER): Payer: No Typology Code available for payment source | Admitting: Oncology

## 2024-02-27 VITALS — BP 141/83 | HR 98 | Temp 98.5°F | Resp 16 | Wt 296.0 lb

## 2024-02-27 DIAGNOSIS — D75839 Thrombocytosis, unspecified: Secondary | ICD-10-CM | POA: Diagnosis not present

## 2024-02-27 DIAGNOSIS — D72825 Bandemia: Secondary | ICD-10-CM

## 2024-02-27 DIAGNOSIS — N920 Excessive and frequent menstruation with regular cycle: Secondary | ICD-10-CM

## 2024-02-27 DIAGNOSIS — D500 Iron deficiency anemia secondary to blood loss (chronic): Secondary | ICD-10-CM

## 2024-02-27 DIAGNOSIS — E538 Deficiency of other specified B group vitamins: Secondary | ICD-10-CM | POA: Insufficient documentation

## 2024-02-27 DIAGNOSIS — D509 Iron deficiency anemia, unspecified: Secondary | ICD-10-CM | POA: Diagnosis not present

## 2024-02-27 LAB — ANTINUCLEAR ANTIBODIES, IFA: ANA Ab, IFA: NEGATIVE

## 2024-02-27 MED ORDER — VITAMIN B-12 1000 MCG PO TABS: 1000.0000 ug | ORAL_TABLET | Freq: Every day | ORAL | 2 refills | Status: AC

## 2024-02-27 NOTE — Progress Notes (Signed)
 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  Iron levels improved after IV iron infusion.  Patient is also started on birth control and has since decreased menstrual bleeding. -Continue oral iron supplementation at this time every other day -Encouraged to eat healthy diet with increased protein and green leafy vegetables  Return to clinic in 3 months with labs  Leukocytosis Leukocytosis improved white blood cell count.  Likely reactive or secondary to chronic UTI. 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  RF negative.  ESR normal, CRP increased. -Follow-up ANA result -Patient is seeing neurology for chronic UTIs.  Menorrhagia Heavy menstrual bleeding has improved since starting birth control, but still present. -Continue current birth control regimen.  Thrombocytosis Likely reactive secondary to iron deficiency. -Continue oral iron supplementation -Continue to monitor levels  Vitamin B12 deficiency Patient has moderate vitamin D B12 deficiency. -Start cyanocobalamin 1000 mcg daily.  Prescription sent to pharmacy.    Orders Placed This Encounter  Procedures   CBC with Differential/Platelet    Standing Status:   Future    Expected Date:   05/25/2024    Expiration Date:   02/26/2025   Comprehensive metabolic panel    Standing Status:   Future    Expected Date:   05/25/2024    Expiration Date:   02/26/2025   Ferritin    Standing Status:   Future    Expected Date:   05/25/2024    Expiration Date:   02/26/2025   Folate    Standing Status:   Future    Expected Date:   05/25/2024    Expiration Date:   02/26/2025   Vitamin B12    Standing  Status:   Future    Expected Date:   05/25/2024    Expiration Date:   02/26/2025   Iron and TIBC    Standing Status:   Future    Expected Date:   05/25/2024    Expiration Date:   02/26/2025    The total time spent in the appointment was 20 minutes encounter with patients including review of chart and various tests results, discussions about plan of care and coordination of care plan   All questions were answered. The patient knows to call the clinic with any problems, questions or concerns. No barriers to learning was detected.  Cindie Crumbly, MD 3/13/20256:00 PM    INTERVAL HISTORY: Alison Bass 23 y.o. female with history of menorrhagia following for leukocytosis and iron deficiency anemia secondary to menorrhagia. She reports that her menstrual bleeding has significantly decreased, which she describes as 'normal.' She continues to take iron supplements every other day. She also reports occasional dizziness, which she is unsure if it is related to her menstrual cycle.She has been experiencing recurrent UTIs, which she is managing with doxycycline. However, she reports that the UTIs have not resolved and she plans to follow up with urology.  I have reviewed the past medical history, past surgical history, social history and family history with the patient   ALLERGIES:  is allergic to aloe, cefzil [cefprozil], dimetapp c [phenylephrine-bromphen-codeine], and ferumoxytol.  MEDICATIONS:  Current Outpatient Medications  Medication Sig Dispense Refill   Adapalene 0.3 % gel Apply  a small amount to skin every evening 45 g 3   Clindamycin Phos-Benzoyl Perox gel Apply a small amount to affected area every morning 50 g 3   cyanocobalamin (VITAMIN B12) 1000 MCG tablet Take 1 tablet (1,000 mcg total) by mouth daily. 90 tablet 2   doxycycline (VIBRAMYCIN) 100 MG capsule Take 1 capsule (100 mg total) by mouth every 12 (twelve) hours. 14 capsule 0   metFORMIN (GLUCOPHAGE) 500 MG tablet TAKE 1  TABLET BY MOUTH 2 TIMES DAILY WITH A MEAL. 180 tablet 1   norethindrone-ethinyl estradiol-FE (LOESTRIN FE 1/20) 1-20 MG-MCG tablet Take 1 tablet by mouth daily. Start pack on Sunday. 84 tablet 3   spironolactone (ALDACTONE) 25 MG tablet Take 1 tablet (25 mg total) by mouth daily. For acne 90 tablet 3   No current facility-administered medications for this visit.   Facility-Administered Medications Ordered in Other Visits  Medication Dose Route Frequency Provider Last Rate Last Admin   0.9 %  sodium chloride infusion   Intravenous Continuous Cindie Crumbly, MD 10 mL/hr at 01/23/24 1422 New Bag at 01/23/24 1422     REVIEW OF SYSTEMS:   Constitutional: Denies fevers, chills or night sweats Eyes: Denies blurriness of vision Ears, nose, mouth, throat, and face: Denies mucositis or sore throat Respiratory: Denies cough, dyspnea or wheezes Cardiovascular: Denies palpitation, chest discomfort or lower extremity swelling Gastrointestinal:  Denies nausea, heartburn or change in bowel habits Skin: Denies abnormal skin rashes Lymphatics: Denies new lymphadenopathy or easy bruising Neurological:Denies numbness, tingling or new weaknesses Behavioral/Psych: Mood is stable, no new changes  All other systems were reviewed with the patient and are negative.  PHYSICAL EXAMINATION:   Vitals:   02/27/24 1408  BP: (!) 141/83  Pulse: 98  Resp: 16  Temp: 98.5 F (36.9 C)  SpO2: 98%    GENERAL:alert, no distress and comfortable SKIN: skin color, texture, turgor are normal, no rashes or significant lesions LYMPH:  no palpable lymphadenopathy in the cervical, axillary or inguinal LUNGS: clear to auscultation and percussion with normal breathing effort HEART: regular rate & rhythm and no murmurs and no lower extremity edema ABDOMEN:abdomen soft, non-tender and normal bowel sounds Musculoskeletal:no cyanosis of digits and no clubbing  NEURO: alert & oriented x 3 with fluent speech  LABORATORY  DATA:  I have reviewed the data as listed  Lab Results  Component Value Date   WBC 11.5 (H) 02/20/2024   NEUTROABS 8.3 (H) 02/20/2024   HGB 13.7 02/20/2024   HCT 42.9 02/20/2024   MCV 84.3 02/20/2024   PLT 408 (H) 02/20/2024      Component Value Date/Time   NA 139 02/20/2024 1358   NA 140 02/08/2023 0956   K 3.9 02/20/2024 1358   CL 104 02/20/2024 1358   CO2 25 02/20/2024 1358   GLUCOSE 88 02/20/2024 1358   BUN 11 02/20/2024 1358   BUN 11 02/08/2023 0956   CREATININE 1.02 (H) 02/20/2024 1358   CALCIUM 9.2 02/20/2024 1358   PROT 7.6 02/20/2024 1358   PROT 7.3 02/08/2023 0956   ALBUMIN 3.4 (L) 02/20/2024 1358   ALBUMIN 4.2 02/08/2023 0956   AST 18 02/20/2024 1358   ALT 31 02/20/2024 1358   ALKPHOS 86 02/20/2024 1358   BILITOT 0.4 02/20/2024 1358   BILITOT 0.3 02/08/2023 0956   GFRNONAA >60 02/20/2024 1358       Chemistry      Component Value Date/Time   NA 139 02/20/2024 1358   NA 140 02/08/2023 0956  K 3.9 02/20/2024 1358   CL 104 02/20/2024 1358   CO2 25 02/20/2024 1358   BUN 11 02/20/2024 1358   BUN 11 02/08/2023 0956   CREATININE 1.02 (H) 02/20/2024 1358      Component Value Date/Time   CALCIUM 9.2 02/20/2024 1358   ALKPHOS 86 02/20/2024 1358   AST 18 02/20/2024 1358   ALT 31 02/20/2024 1358   BILITOT 0.4 02/20/2024 1358   BILITOT 0.3 02/08/2023 0956      Latest Reference Range & Units 02/20/24 13:58  Iron 28 - 170 ug/dL 56  UIBC ug/dL 161  TIBC 096 - 045 ug/dL 409  Saturation Ratios 10.4 - 31.8 % 16  Ferritin 11 - 307 ng/mL 357 (H)  Folate >5.9 ng/mL 8.3  (H): Data is abnormally high  Latest Reference Range & Units 11/20/23 15:33  Sed Rate 0 - 22 mm/hr 17    Latest Reference Range & Units 11/20/23 15:33  CRP <1.0 mg/dL 3.0 (H)  (H): Data is abnormally high   Latest Reference Range & Units 02/20/24 13:58  RA Latex Turbid. <14.0 IU/mL <10.0   Flow cytometry: 11/20/2023: DIAGNOSIS:   -Slight increase in gamma delta T cells.  -No  monoclonal B-cell population identified  -See comment   COMMENT:   Flow cytometric analysis of the lymphoid population shows predominance  of T cells (13% of all white blood cells) displaying expression of pan  T-cell antigens with normal CD4:CD8 ratio.  However, there is slight  increase in the proportion of T cells with gamma delta phenotype (11% of  T cells).  B cells represent a minor population with no monoclonality.  The T-cell changes are limited and likely represent secondary changes in  most settings due infection, connective tissue disease, immune  suppression/dysregulation.  Clinical correlation and follow-up are  recommended.

## 2024-02-27 NOTE — Assessment & Plan Note (Signed)
 Iron deficiency anemia likely secondary to menorrhagia.  Iron levels improved after IV iron infusion.  Patient is also started on birth control and has since decreased menstrual bleeding. -Continue oral iron supplementation at this time every other day -Encouraged to eat healthy diet with increased protein and green leafy vegetables  Return to clinic in 3 months with labs

## 2024-02-27 NOTE — Assessment & Plan Note (Signed)
 Heavy menstrual bleeding has improved since starting birth control, but still present. -Continue current birth control regimen.

## 2024-02-27 NOTE — Patient Instructions (Signed)
 VISIT SUMMARY:  During today's visit, we discussed your ongoing health concerns, including anemia, recurrent urinary tract infections (UTIs), and dizziness. We reviewed your current medications and symptoms, and we have made some adjustments to your treatment plan to better manage your conditions.  YOUR PLAN:  -LEUKOCYTOSIS: Leukocytosis means having a high white blood cell count, which can be due to various reasons, including infections. We will continue to monitor your white blood cell count and await the results from your rheumatology workup. Please follow up with urology for your recurrent UTIs, as they may be contributing to this issue.  -RECURRENT URINARY TRACT INFECTIONS: Recurrent UTIs are repeated infections of the urinary tract. Since doxycycline has not been effective, it is important to follow up with urology for further evaluation and management.  -IRON DEFICIENCY ANEMIA: Iron deficiency anemia is a condition where your body lacks enough iron to produce healthy red blood cells. Continue taking your oral iron supplements every other day, and we will monitor your iron levels to ensure they remain within the normal range.  -VITAMIN B12 DEFICIENCY: Vitamin B12 deficiency can lead to anemia and other health issues. This may be related to your use of metformin. We recommend you start taking vitamin B12 supplements, either 1000 micrograms prescribed or over-the-counter.  INSTRUCTIONS:  Please schedule a follow-up appointment in three months. At that time, we will reassess your conditions and adjust your treatment as necessary. Additionally, repeat laboratory tests will be conducted at your follow-up visit.

## 2024-02-27 NOTE — Assessment & Plan Note (Signed)
 Patient has moderate vitamin D B12 deficiency. -Start cyanocobalamin 1000 mcg daily.  Prescription sent to pharmacy.

## 2024-02-27 NOTE — Assessment & Plan Note (Signed)
 Likely reactive secondary to iron deficiency. -Continue oral iron supplementation -Continue to monitor levels

## 2024-02-27 NOTE — Assessment & Plan Note (Signed)
 Leukocytosis improved white blood cell count.  Likely reactive or secondary to chronic UTI. 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  RF negative.  ESR normal, CRP increased. -Follow-up ANA result -Patient is seeing neurology for chronic UTIs.

## 2024-02-28 ENCOUNTER — Ambulatory Visit: Payer: No Typology Code available for payment source | Admitting: Urology

## 2024-02-28 ENCOUNTER — Encounter: Payer: Self-pay | Admitting: Urology

## 2024-02-28 VITALS — BP 121/77 | HR 113 | Temp 97.3°F

## 2024-02-28 DIAGNOSIS — R8290 Unspecified abnormal findings in urine: Secondary | ICD-10-CM | POA: Diagnosis not present

## 2024-02-28 DIAGNOSIS — R300 Dysuria: Secondary | ICD-10-CM

## 2024-02-28 LAB — MICROSCOPIC EXAMINATION: RBC, Urine: >30 /HPF — AB (ref 0–2)

## 2024-02-28 LAB — URINALYSIS, ROUTINE W REFLEX MICROSCOPIC
Bilirubin, UA: NEGATIVE
Glucose, UA: NEGATIVE
Ketones, UA: NEGATIVE
Nitrite, UA: NEGATIVE
Specific Gravity, UA: 1.025 (ref 1.005–1.030)
Urobilinogen, Ur: 0.2 mg/dL (ref 0.2–1.0)
pH, UA: 6 (ref 5.0–7.5)

## 2024-02-28 LAB — BLADDER SCAN AMB NON-IMAGING: Scan Result: 0

## 2024-02-28 NOTE — Patient Instructions (Signed)
 UTI prevention / management:  Difference between Urinalysis (urine dipstick test) and Urine culture:  Urinalysis (urine dipstick test): A quick office test used as an indicator to determine whether or not further testing is necessary (such as a urine culture, urine microscopy, etc.) The urinalysis cannot differentiate a true bacterial UTI or give a definitive diagnosis for the findings.  Urine culture: May be performed based on the findings of a urinalysis to evaluate for UTI. Grows out on a petri dish for 48-72 hours. Provides important information about: whether or not bacterial growth is present and if so: what the predominant bacteria is which antibiotics will work best against that bacteria That information is important so that we can diagnose and treat patients appropriately as there are other conditions which may mimic UTls which must not be missed (such as cancer, interstitial cystitis, stones, etc.). Assists [place] with antibiotic stewardship to minimize patient's risk for developing antibiotic resistance (getting to a point where no antibiotics work anymore).  Options when UTI symptoms occur: 1. Call [facility] [place] and request to speak with triage nurse (phone # [phone], select option 3). In accordance with clinic guidelines the nurse will determine next steps based on patient-reported symptoms, which may include: same-day lab visit to provide urine specimen, recommendation to schedule Urology office visit appointment for further evaluation, recommendation to proceed to ER, etc. 2. Call your Primary Care Provider (PCP) office to request urgent / same-day visit. Be sure to request for urine culture to be ordered and have results faxed to Urology (fax # [phone]).  3. Go to urgent care. Be sure to request for urine culture to be ordered and have results faxed to Urology (fax # [phone]).   For bladder pain/ burning with urination: - Can take over-the-counter  Pyridium (phenazopyridine; commonly known under the "AZO" brand) for a few days as needed. Limit use to no more than 3 days consecutively due to risk for methemoglobinemia, liver function issues, and bone health damage with long term use of Pyridium. - Alternative: Prescription urinary analgesics (such as Uribel, Urogesic blue, Urelle, Uro-MP). Often expensive / poorly covered by insurance unfortunately.  Options / recommendations for UTI prevention: - Adequate daily fluid intake to flush out the urinary tract. - Go to the bathroom to urinate every 4-6 hours while awake to minimize urinary stasis / bacterial overgrowth in the bladder. - Proanthocyanidin (PAC) supplement 36 mg daily; must be soluble (insoluble form of PAC will be ineffective). Recommended brand: Ellura. This is an over-the-counter supplement (often must be found/ purchased online) supplement derived from cranberries with concentrated active component: Proanthocyanidin (PAC) 36 mg daily. Decreases bacterial adherence to bladder lining.  - D-mannose powder (2 grams daily). This is an over-the-counter supplement which decreases bacterial adherence to bladder lining (it is a sugar that inhibits bacterial adherence to urothelial cells by binding to the pili of enteric bacteria). Take as per manufacturer recommendation. Can be used as an alternative or in addition to the concentrated cranberry supplement.  - Vitamin C supplement to acidify urine to minimize bacterial growth.  - Probiotic to maintain healthy vaginal microbiome to suppress bacteria at urethral opening. Brand recommendations: Darrold Junker (includes probiotic & D-mannose ), Feminine Balance (highest concentration of lactobacillus) or Hyperbiotic Pro 15.  Note for patients with diabetes:  - Be aware that D-mannose contains sugar.  Note for patients with interstitial cystitis (IC):  - Patients with IC should typically avoid cranberry/ PAC supplements and Vitamin C supplements due to  their acidity,  which may exacerbate IC-related bladder pain. - Symptoms of true bacterial UTI can overlap / mimic symptoms of an IC flare up. Antibiotic use is NOT indicated for IC flare ups. Urine culture needed prior to antibiotic treatment for IC patients. The goal is to minimize your risk for developing antibiotic-resistant bacteria.

## 2024-02-28 NOTE — Progress Notes (Signed)
 Name: Alison Bass DOB: 2001/08/14 MRN: [id number]  History of Present Illness: Alison Bass is a 23 y.o. female who presents today for follow up visit at [facility] [place].   At 1st visit with Dr. Ronne Binning on 01/29/2024: - Seen for pelvic pain and recurrent UTI. "For the past year she had has been treated for 4 UTIs in the past year and gets the sensation of a UTI every 1-2 weeks. She gets urinary urgency/frequency. She has been treated with macrobid and the symptoms only partially improve." - PVR = 14 ml. - Urine microscopy: 6-10 WBC/hpf, 0 RBC/hpf, moderate bacteria. - Treated empirically with Doxycycline 100mg  BID for 7 days. - Plan was to send urine for culture, but unfortunately it didn't get sent.  Urine culture results in past 12 months: - 10/03/2023: Negative - 10/25/2023: Negative  Today: Denies any improvement with the Doxycycline course, which she completed as prescribed.  She reports that for 6-12 months she has been having intermittent dysuria and increased urinary urgency / frequency. It usually lasts for 6-8 hours at a time; she feels mild symptoms at least once per week on average and has severe pain episodes about once every 1-2 months. She is minimally symptomatic today.   Denies bladder pain, abdominal pain, or flank pain. Denies gross hematuria, hesitancy, straining to void, sensations of incomplete emptying.  She denies pain with bladder filling. She denies relief with voiding. She denies identified dietary or environmental triggers. She reports it seems to be triggered by physical or emotional stress.    Denies any history of trauma to the genitourinary area.  Denies history of catheterization in the past year  She denies vaginal pain, abnormal discharge, itching, dryness. Denies use of vaginal douches. She is sexually active in a mutually monogamous heterosexual relationship. Denies dyspareunia. Denies concern for STDs. Follows routinely  with GYN provider.  She denies history of kidney stones.    Medications: Current Outpatient Medications  Medication Sig Dispense Refill   Adapalene 0.3 % gel Apply a small amount to skin every evening 45 g 3   Clindamycin Phos-Benzoyl Perox gel Apply a small amount to affected area every morning 50 g 3   cyanocobalamin (VITAMIN B12) 1000 MCG tablet Take 1 tablet (1,000 mcg total) by mouth daily. 90 tablet 2   doxycycline (VIBRAMYCIN) 100 MG capsule Take 1 capsule (100 mg total) by mouth every 12 (twelve) hours. 14 capsule 0   metFORMIN (GLUCOPHAGE) 500 MG tablet TAKE 1 TABLET BY MOUTH 2 TIMES DAILY WITH A MEAL. 180 tablet 1   norethindrone-ethinyl estradiol-FE (LOESTRIN FE 1/20) 1-20 MG-MCG tablet Take 1 tablet by mouth daily. Start pack on Sunday. 84 tablet 3   spironolactone (ALDACTONE) 25 MG tablet Take 1 tablet (25 mg total) by mouth daily. For acne 90 tablet 3   No current facility-administered medications for this visit.   Facility-Administered Medications Ordered in Other Visits  Medication Dose Route Frequency Provider Last Rate Last Admin   0.9 %  sodium chloride infusion   Intravenous Continuous Cindie Crumbly, MD 10 mL/hr at 01/23/24 1422 New Bag at 01/23/24 1422    Allergies: Allergies  Allergen Reactions   Aloe     redness   Cefzil [Cefprozil]     somach upset   Dimetapp C [Phenylephrine-Bromphen-Codeine]     fussiness   Ferumoxytol Other (See Comments)    Drop in blood pressure, dizziness, abdominal cramping.     Past Medical History:  Diagnosis Date   Allergy  GERD (gastroesophageal reflux disease)    Plagiocephaly    History reviewed. No pertinent surgical history. Family History  Problem Relation Age of Onset   Diabetes Mother    Heart disease Paternal Grandfather        Died at 76   Social History   Socioeconomic History   Marital status: Single    Spouse name: Not on file   Number of children: Not on file   Years of education: Not on file    Highest education level: 12th grade  Occupational History   Not on file  Tobacco Use   Smoking status: Never   Smokeless tobacco: Never  Vaping Use   Vaping status: Never Used  Substance and Sexual Activity   Alcohol use: No   Drug use: No   Sexual activity: Yes    Birth control/protection: Condom, Pill  Other Topics Concern   Not on file  Social History Narrative   Not on file   Social Drivers of Health   Financial Resource Strain: Low Risk  (09/24/2023)   Overall Financial Resource Strain (CARDIA)    Difficulty of Paying Living Expenses: Not very hard  Food Insecurity: No Food Insecurity (09/24/2023)   Hunger Vital Sign    Worried About Running Out of Food in the Last Year: Never true    Ran Out of Food in the Last Year: Never true  Transportation Needs: No Transportation Needs (09/24/2023)   PRAPARE - Administrator, Civil Service (Medical): No    Lack of Transportation (Non-Medical): No  Physical Activity: Insufficiently Active (09/24/2023)   Exercise Vital Sign    Days of Exercise per Week: 3 days    Minutes of Exercise per Session: 30 min  Stress: No Stress Concern Present (09/24/2023)   [employer] of Occupational Health - Occupational Stress Questionnaire    Feeling of Stress : Only a little  Social Connections: Socially Isolated (09/24/2023)   Social Connection and Isolation Panel [NHANES]    Frequency of Communication with Friends and Family: More than three times a week    Frequency of Social Gatherings with Friends and Family: More than three times a week    Attends Religious Services: Never    Database administrator or Organizations: No    Attends Engineer, structural: Not on file    Marital Status: Never married  Catering manager Violence: Not on file    Review of Systems Constitutional: Patient denies any unintentional weight loss or change in strength lntegumentary: Patient denies any rashes or pruritus Cardiovascular: Patient  denies chest pain or syncope Respiratory: Patient denies shortness of breath Gastrointestinal: Patient denies constipation or diarrhea Musculoskeletal: Patient denies muscle cramps or weakness Neurologic: Patient denies convulsions or seizures Allergic/Immunologic: Patient denies recent allergic reaction(s) Hematologic/Lymphatic: Patient denies bleeding tendencies Endocrine: Patient denies heat/cold intolerance  GU: As per HPI.  OBJECTIVE Vitals:   02/28/24 1304  BP: 121/77  Pulse: (!) 113  Temp: (!) 97.3 F (36.3 C)   There is no height or weight on file to calculate BMI.  Physical Examination Constitutional: No obvious distress; patient is non-toxic appearing  Cardiovascular: No visible lower extremity edema.  Respiratory: The patient does not have audible wheezing/stridor; respirations do not appear labored  Gastrointestinal: Abdomen non-distended Musculoskeletal: Normal ROM of UEs  Skin: No obvious rashes/open sores  Neurologic: CN 2-12 grossly intact Psychiatric: Answered questions appropriately with normal affect  Hematologic/Lymphatic/Immunologic: No obvious bruises or sites of spontaneous bleeding  Urine microscopy:  6-10 WBC/hpf, >30 RBC/hpf, few bacteria PVR: 0 ml  ASSESSMENT Dysuria - Plan: Urinalysis, Routine w reflex microscopic, BLADDER SCAN AMB NON-IMAGING, Urine culture  Abnormal urinalysis - Plan: Urine culture  Abnormal UA. Will check urine culture and treat as indicated based on results. Agreed not to treat again empirically; discussed goal of antibiotic stewardship to minimize risk for development of antibiotic resistance.  If regular urine culture comes back negative, will then plan for her to come back for a lab visit and send urine for MDX urine culture.   If both regular urine culture and MDX urine culture come back negative and symptoms persist, will then proceed with renal/bladder ultrasound and/or cystoscopy for further evaluation.  Discussed  option to take over-the-counter Pyridium (commonly known under the AZO brand name) PRN for dysuria; no more than 3 days at a time.  We agreed to plan for follow up in 4 weeks or sooner if needed. Patient verbalized understanding of and agreement with current plan. All questions were answered.  PLAN Advised the following: 1. Urine culture. 2. Return in about 4 weeks (around 03/27/2024) for UA, PVR, & f/u with Evette Georges NP.  Orders Placed This Encounter  Procedures   Urine culture   Urinalysis, Routine w reflex microscopic   BLADDER SCAN AMB NON-IMAGING    It has been explained that the patient is to follow regularly with their PCP in addition to all other providers involved in their care and to follow instructions provided by these respective offices. Patient advised to contact urology clinic if any urologic-pertaining questions, concerns, new symptoms or problems arise in the interim period.  Patient Instructions  UTI prevention / management:  Difference between Urinalysis (urine dipstick test) and Urine culture:  Urinalysis (urine dipstick test): A quick office test used as an indicator to determine whether or not further testing is necessary (such as a urine culture, urine microscopy, etc.) The urinalysis cannot differentiate a true bacterial UTI or give a definitive diagnosis for the findings.  Urine culture: May be performed based on the findings of a urinalysis to evaluate for UTI. Grows out on a petri dish for 48-72 hours. Provides important information about: whether or not bacterial growth is present and if so: what the predominant bacteria is which antibiotics will work best against that bacteria That information is important so that we can diagnose and treat patients appropriately as there are other conditions which may mimic UTls which must not be missed (such as cancer, interstitial cystitis, stones, etc.). Assists [place] with antibiotic stewardship to minimize patient's  risk for developing antibiotic resistance (getting to a point where no antibiotics work anymore).  Options when UTI symptoms occur: 1. Call [facility] [place] and request to speak with triage nurse (phone # [phone], select option 3). In accordance with clinic guidelines the nurse will determine next steps based on patient-reported symptoms, which may include: same-day lab visit to provide urine specimen, recommendation to schedule Urology office visit appointment for further evaluation, recommendation to proceed to ER, etc. 2. Call your Primary Care Provider (PCP) office to request urgent / same-day visit. Be sure to request for urine culture to be ordered and have results faxed to Urology (fax # [phone]).  3. Go to urgent care. Be sure to request for urine culture to be ordered and have results faxed to Urology (fax # [phone]).   For bladder pain/ burning with urination: - Can take over-the-counter Pyridium (phenazopyridine; commonly known under the "AZO" brand) for a few  days as needed. Limit use to no more than 3 days consecutively due to risk for methemoglobinemia, liver function issues, and bone health damage with long term use of Pyridium. - Alternative: Prescription urinary analgesics (such as Uribel, Urogesic blue, Urelle, Uro-MP). Often expensive / poorly covered by insurance unfortunately.  Options / recommendations for UTI prevention: - Adequate daily fluid intake to flush out the urinary tract. - Go to the bathroom to urinate every 4-6 hours while awake to minimize urinary stasis / bacterial overgrowth in the bladder. - Proanthocyanidin (PAC) supplement 36 mg daily; must be soluble (insoluble form of PAC will be ineffective). Recommended brand: Ellura. This is an over-the-counter supplement (often must be found/ purchased online) supplement derived from cranberries with concentrated active component: Proanthocyanidin (PAC) 36 mg daily. Decreases bacterial  adherence to bladder lining.  - D-mannose powder (2 grams daily). This is an over-the-counter supplement which decreases bacterial adherence to bladder lining (it is a sugar that inhibits bacterial adherence to urothelial cells by binding to the pili of enteric bacteria). Take as per manufacturer recommendation. Can be used as an alternative or in addition to the concentrated cranberry supplement.  - Vitamin C supplement to acidify urine to minimize bacterial growth.  - Probiotic to maintain healthy vaginal microbiome to suppress bacteria at urethral opening. Brand recommendations: Darrold Junker (includes probiotic & D-mannose ), Feminine Balance (highest concentration of lactobacillus) or Hyperbiotic Pro 15.  Note for patients with diabetes:  - Be aware that D-mannose contains sugar.  Note for patients with interstitial cystitis (IC):  - Patients with IC should typically avoid cranberry/ PAC supplements and Vitamin C supplements due to their acidity, which may exacerbate IC-related bladder pain. - Symptoms of true bacterial UTI can overlap / mimic symptoms of an IC flare up. Antibiotic use is NOT indicated for IC flare ups. Urine culture needed prior to antibiotic treatment for IC patients. The goal is to minimize your risk for developing antibiotic-resistant bacteria.   Electronically signed by:  Donnita Falls, FNP   02/28/24    2:43 PM

## 2024-03-01 LAB — URINE CULTURE

## 2024-03-09 ENCOUNTER — Encounter: Payer: Self-pay | Admitting: Oncology

## 2024-03-17 ENCOUNTER — Ambulatory Visit
Admission: EM | Admit: 2024-03-17 | Discharge: 2024-03-17 | Disposition: A | Discharge status: Home / Self Care | Attending: Nurse Practitioner | Admitting: Nurse Practitioner

## 2024-03-17 DIAGNOSIS — N898 Other specified noninflammatory disorders of vagina: Secondary | ICD-10-CM | POA: Insufficient documentation

## 2024-03-17 MED ORDER — FLUCONAZOLE 150 MG PO TABS: 150.0000 mg | ORAL_TABLET | Freq: Once | ORAL | 0 refills | Status: AC

## 2024-03-17 NOTE — Discharge Instructions (Addendum)
 Take the fluconazole pill to treat for yeast infection.  We will contact you if the vaginal swab comes back positive for anything else.

## 2024-03-17 NOTE — ED Triage Notes (Signed)
 Pt reports she has vaginal irritation and  dryness x 1 day

## 2024-03-17 NOTE — ED Provider Notes (Signed)
 [facility]    CSN: [id number] Arrival date & time: 03/17/24  1004      History   Chief Complaint No chief complaint on file.   HPI Alison Bass is a 23 y.o. female.   Patient presents today with 2-day history of vaginal itching and irritation.  Reports when she performed the vaginal swab today, she noticed thick, clumpy vaginal discharge.  She denies any other significant vaginal discharge, abdominal pain, or pelvic pain.  No groin swelling, vaginal rashes, sores, or lesions.  She reports history of frequent UTIs/is being evaluated by urology currently for chronic UTI symptoms but denies any recent change in the symptoms.  No fevers or nausea/vomiting.  No known exposures to STI and no concern for STI today, however is sexually active and is agreeable to testing.  She declines HIV/RPR testing.  Patient is confident she is not pregnant; LMP: 02/25/2024; also takes oral contraceptives at the same time every day.    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 No pertinent surgical history.  OB History   No obstetric history on file.      Home Medications    Prior to Admission medications   Medication Sig Start Date End Date Taking? Authorizing Provider  fluconazole (DIFLUCAN) 150 MG tablet Take 1 tablet (150 mg total) by mouth once for 1 dose. 03/17/24 03/17/24 Yes Cathlean Marseilles A, NP  Adapalene 0.3 % gel Apply a small amount to skin every evening 10/23/21     Clindamycin Phos-Benzoyl Perox gel Apply a small amount to affected area every morning 10/23/21     cyanocobalamin (VITAMIN B12) 1000 MCG tablet Take 1 tablet (1,000 mcg total) by mouth daily. 02/27/24   Cindie Crumbly, MD  doxycycline (VIBRAMYCIN) 100 MG capsule Take 1 capsule (100 mg total) by mouth every 12 (twelve) hours. 01/29/24   McKenzie, Mardene Celeste, MD  metFORMIN (GLUCOPHAGE) 500 MG tablet TAKE 1 TABLET BY MOUTH 2 TIMES DAILY WITH A MEAL. 05/07/23   Tommie Sams, DO  norethindrone-ethinyl estradiol-FE (LOESTRIN FE 1/20) 1-20 MG-MCG tablet Take 1 tablet by mouth daily. Start pack on Sunday. 12/20/23   Campbell Riches, NP  spironolactone (ALDACTONE) 25 MG tablet Take 1 tablet (25 mg total) by mouth daily. For acne 12/20/23   Campbell Riches, NP    Family History Family History  Problem Relation Age of Onset   Diabetes Mother    Heart disease Paternal Grandfather  Died at 78    Social History Social History   Tobacco Use   Smoking status: Never   Smokeless tobacco: Never  Vaping Use   Vaping status: Never Used  Substance Use Topics   Alcohol use: No   Drug use: No     Allergies   Aloe, Cefzil [cefprozil], Dimetapp c [phenylephrine-bromphen-codeine], and Ferumoxytol   Review of Systems Review of Systems Per HPI  Physical Exam Triage Vital Signs ED Triage Vitals  Encounter Vitals Group      BP 03/17/24 1017 115/84     Systolic BP Percentile --      Diastolic BP Percentile --      Pulse Rate 03/17/24 1017 83     Resp 03/17/24 1017 18     Temp 03/17/24 1017 98.1 F (36.7 C)     Temp Source 03/17/24 1017 Oral     SpO2 03/17/24 1017 98 %     Weight --      Height --      Head Circumference --      Peak Flow --      Pain Score 03/17/24 1018 0     Pain Loc --      Pain Education --      Exclude from Growth Chart --    No data found.  Updated Vital Signs BP 115/84 (BP Location: Right Arm)   Pulse 83   Temp 98.1 F (36.7 C) (Oral)   Resp 18   LMP 02/25/2024   SpO2 98%   Visual Acuity Right Eye Distance:   Left Eye Distance:   Bilateral Distance:    Right Eye Near:   Left Eye Near:    Bilateral Near:     Physical Exam Vitals and nursing note reviewed.  Constitutional:      General: She is not in acute distress.    Appearance: Normal appearance. She is not toxic-appearing.  Pulmonary:     Effort: Pulmonary effort is normal. No respiratory distress.  Genitourinary:    Comments: Deferred - self swab performed by patient Skin:    General: Skin is warm and dry.     Coloration: Skin is not jaundiced or pale.     Findings: No erythema.  Neurological:     Mental Status: She is alert and oriented to person, place, and time.     Motor: No weakness.     Gait: Gait normal.  Psychiatric:        Behavior: Behavior is cooperative.      UC Treatments / Results  Labs (all labs ordered are listed, but only abnormal results are displayed) Labs Reviewed  CERVICOVAGINAL ANCILLARY ONLY    EKG   Radiology No results found.  Procedures Procedures (including critical care time)  Medications Ordered in UC Medications - No data to display  Initial Impression / Assessment and Plan / UC Course  I have reviewed the triage vital signs and the nursing notes.  Pertinent labs & imaging results that were available during my care of the patient were reviewed  by me and considered in my medical decision making (see chart for details).   Patient is well-appearing, normotensive, afebrile, not tachycardic, not tachypneic, oxygenating well on room air.    1. Vaginal itching No red flags Vaginal self swab cytology is pending-treat as indicated if anything other than yeast vaginitis We are treating for yeast infection today with fluconazole 1 pill Return and ER precautions discussed with patient Safe sex practices discussed  The patient was given the opportunity to ask questions.  All questions answered to their satisfaction.  The patient is in agreement to this plan.   Final Clinical Impressions(s) / UC Diagnoses   Final diagnoses:  Vaginal itching     Discharge Instructions      Take the fluconazole pill to treat for yeast infection.  We will contact you if the vaginal swab comes back positive for anything else.     ED Prescriptions     Medication Sig Dispense Auth. Provider   fluconazole (DIFLUCAN) 150 MG tablet Take 1 tablet (150 mg total) by mouth once for 1 dose. 1 tablet Valentino Nose, NP      PDMP not reviewed this encounter.   Valentino Nose, NP 03/17/24 1116

## 2024-03-18 LAB — CERVICOVAGINAL ANCILLARY ONLY
Bacterial Vaginitis (gardnerella): NEGATIVE
Candida Glabrata: NEGATIVE
Candida Vaginitis: POSITIVE — AB
Chlamydia: NEGATIVE
Comment: NEGATIVE
Comment: NEGATIVE
Comment: NEGATIVE
Comment: NEGATIVE
Comment: NEGATIVE
Comment: NORMAL
Neisseria Gonorrhea: NEGATIVE
Trichomonas: NEGATIVE

## 2024-03-27 ENCOUNTER — Encounter: Payer: Self-pay | Admitting: Urology

## 2024-03-27 ENCOUNTER — Ambulatory Visit: Admitting: Urology

## 2024-03-27 VITALS — BP 130/81 | HR 88

## 2024-03-27 DIAGNOSIS — Z87898 Personal history of other specified conditions: Secondary | ICD-10-CM

## 2024-03-27 DIAGNOSIS — R3915 Urgency of urination: Secondary | ICD-10-CM | POA: Diagnosis not present

## 2024-03-27 DIAGNOSIS — R3989 Other symptoms and signs involving the genitourinary system: Secondary | ICD-10-CM | POA: Diagnosis not present

## 2024-03-27 DIAGNOSIS — R300 Dysuria: Secondary | ICD-10-CM | POA: Diagnosis not present

## 2024-03-27 DIAGNOSIS — R8290 Unspecified abnormal findings in urine: Secondary | ICD-10-CM

## 2024-03-27 LAB — POCT URINALYSIS DIPSTICK
Bilirubin, UA: NEGATIVE
Glucose, UA: NEGATIVE
Ketones, UA: NEGATIVE
Nitrite, UA: NEGATIVE
Protein, UA: POSITIVE — AB
Spec Grav, UA: 1.025 (ref 1.010–1.025)
Urobilinogen, UA: 0.2 U/dL
pH, UA: 7 (ref 5.0–8.0)

## 2024-03-27 MED ORDER — OXYBUTYNIN CHLORIDE 5 MG PO TABS
5.0000 mg | ORAL_TABLET | Freq: Three times a day (TID) | ORAL | 1 refills | Status: AC | PRN
Start: 2024-03-27 — End: ?

## 2024-03-27 NOTE — Progress Notes (Signed)
 Name: Alison Bass DOB: 2001/01/17 MRN: [id number]  History of Present Illness: Alison Bass is a 23 y.o. female who presents today for follow up visit at [facility] [place].   Urine culture results in past 12 months: - 10/03/2023: Negative - 10/25/2023: Negative  Recent history: > 01/29/2024: Seen by Dr. Ronne Binning for recurrent pelvic pain with urinary urgency/frequency refractory to >4 rounds of antibiotic treatment for suspected UTIs in the past year. Treated empirically with Doxycycline 100mg  BID for 7 days.  > 02/28/2024: Urology follow up.  - No improvement with Doxycycline. - Reported over prior x6-12 months has had intermittent dysuria and increased urinary urgency / frequency for 6-8 hours at a time. Mild symptoms 1x/week and severe pain episodes every 1-2 months (on average). Symptoms reportedly seem to be triggered by physical or emotional stress.  - Urine culture sent (negative).  Today: She reports persistent episodes of intermittent LUTS with dysuria, urgency, frequency, and bladder pain. Also reports prior episodes of gross hematuria.    Medications: Current Outpatient Medications  Medication Sig Dispense Refill   Adapalene 0.3 % gel Apply a small amount to skin every evening 45 g 3   Clindamycin Phos-Benzoyl Perox gel Apply a small amount to affected area every morning 50 g 3   cyanocobalamin (VITAMIN B12) 1000 MCG tablet Take 1 tablet (1,000 mcg total) by mouth daily. 90 tablet 2   norethindrone-ethinyl estradiol-FE (LOESTRIN FE 1/20) 1-20 MG-MCG tablet Take 1 tablet by mouth daily. Start pack on Sunday. 84 tablet 3   oxybutynin (DITROPAN) 5 MG tablet Take 1 tablet (5 mg total) by mouth every 8 (eight) hours as needed for bladder spasms. 30 tablet 1   spironolactone (ALDACTONE) 25 MG tablet Take 1 tablet (25 mg total) by mouth daily. For acne 90 tablet 3   doxycycline (VIBRAMYCIN) 100 MG capsule Take 1 capsule (100 mg total) by mouth every 12  (twelve) hours. (Patient not taking: Reported on 03/27/2024) 14 capsule 0   metFORMIN (GLUCOPHAGE) 500 MG tablet TAKE 1 TABLET BY MOUTH 2 TIMES DAILY WITH A MEAL. (Patient not taking: Reported on 03/27/2024) 180 tablet 1   No current facility-administered medications for this visit.    Allergies: Allergies  Allergen Reactions   Aloe     redness   Cefzil [Cefprozil]     somach upset   Dimetapp C [Phenylephrine-Bromphen-Codeine]     fussiness   Ferumoxytol Other (See Comments)    Drop in blood pressure, dizziness, abdominal cramping.     Past Medical History:  Diagnosis Date   Allergy    GERD (gastroesophageal reflux disease)    Plagiocephaly    History reviewed. No pertinent surgical history. Family History  Problem Relation Age of Onset   Diabetes Mother    Heart disease Paternal Grandfather        Died at 43   Social History   Socioeconomic History   Marital status: Single    Spouse name: Not on file   Number of children: Not on file   Years of education: Not on file   Highest education level: 12th grade  Occupational History   Not on file  Tobacco Use   Smoking status: Never   Smokeless tobacco: Never  Vaping Use   Vaping status: Never Used  Substance and Sexual Activity   Alcohol use: No   Drug use: No   Sexual activity: Yes    Birth control/protection: Condom, Pill  Other Topics Concern   Not on file  Social History Narrative   Not on file   Social Drivers of Health   Financial Resource Strain: Low Risk  (09/24/2023)   Overall Financial Resource Strain (CARDIA)    Difficulty of Paying Living Expenses: Not very hard  Food Insecurity: No Food Insecurity (09/24/2023)   Hunger Vital Sign    Worried About Running Out of Food in the Last Year: Never true    Ran Out of Food in the Last Year: Never true  Transportation Needs: No Transportation Needs (09/24/2023)   PRAPARE - Administrator, Civil Service (Medical): No    Lack of Transportation  (Non-Medical): No  Physical Activity: Insufficiently Active (09/24/2023)   Exercise Vital Sign    Days of Exercise per Week: 3 days    Minutes of Exercise per Session: 30 min  Stress: No Stress Concern Present (09/24/2023)   [employer] of Occupational Health - Occupational Stress Questionnaire    Feeling of Stress : Only a little  Social Connections: Socially Isolated (09/24/2023)   Social Connection and Isolation Panel [NHANES]    Frequency of Communication with Friends and Family: More than three times a week    Frequency of Social Gatherings with Friends and Family: More than three times a week    Attends Religious Services: Never    Database administrator or Organizations: No    Attends Engineer, structural: Not on file    Marital Status: Never married  Catering manager Violence: Not on file    Review of Systems Constitutional: Patient denies any unintentional weight loss or change in strength lntegumentary: Patient denies any rashes or pruritus Cardiovascular: Patient denies chest pain or syncope Respiratory: Patient denies shortness of breath Gastrointestinal: Patient denies nausea, vomiting, constipation, diarrhea Musculoskeletal: Patient denies muscle cramps or weakness Neurologic: Patient denies convulsions or seizures Allergic/Immunologic: Patient denies recent allergic reaction(s) Hematologic/Lymphatic: Patient denies bleeding tendencies Endocrine: Patient denies heat/cold intolerance  GU: As per HPI.  OBJECTIVE Vitals:   03/27/24 1241  BP: 130/81  Pulse: 88   There is no height or weight on file to calculate BMI.  Physical Examination Constitutional: No obvious distress; patient is non-toxic appearing  Cardiovascular: No visible lower extremity edema.  Respiratory: The patient does not have audible wheezing/stridor; respirations do not appear labored  Gastrointestinal: Abdomen non-distended Musculoskeletal: Normal ROM of UEs  Skin: No obvious  rashes/open sores  Neurologic: CN 2-12 grossly intact Psychiatric: Answered questions appropriately with normal affect  Hematologic/Lymphatic/Immunologic: No obvious bruises or sites of spontaneous bleeding  UA: positive for trace leukocytes, 3+ blood, 1+ protein; no nitrites (on menses) PVR: 0 ml  ASSESSMENT Dysuria - Plan: BLADDER SCAN AMB NON-IMAGING, POCT urinalysis dipstick, oxybutynin (DITROPAN) 5 MG tablet, US RENAL  Abnormal urinalysis - Plan: BLADDER SCAN AMB NON-IMAGING, POCT urinalysis dipstick, oxybutynin (DITROPAN) 5 MG tablet, US RENAL  Urgency of urination - Plan: oxybutynin (DITROPAN) 5 MG tablet, US RENAL  Bladder pain - Plan: oxybutynin (DITROPAN) 5 MG tablet, US RENAL  History of gross hematuria - Plan: oxybutynin (DITROPAN) 5 MG tablet, US RENAL  For persistent LUTS of undetermined etiology we agreed to send urine for MDX urine culture to assess for possible atypical urine pathogens. Will also proceed with RUS and cystoscopy for further evaluation due to history of prior episodic gross hematuria and chronicity of her LUTS (>12 months). For symptom management we agreed to trial Ditropan (Oxybutynin) 5 mg every 8 hours PRN; potential side effects discussed. Patient verbalized understanding of and agreement  with current plan. All questions were answered.  PLAN Advised the following: 1. MDX urine culture. 2. Ditropan (Oxybutynin) 5 mg every 8 hours PRN.  3. RUS. 4. Return for 1st available cystoscopy with any urology MD.  Orders Placed This Encounter  Procedures   US RENAL    Standing Status:   Future    Expected Date:   03/27/2024    Expiration Date:   03/27/2025    Reason for Exam (SYMPTOM  OR DIAGNOSIS REQUIRED):   kidney stone known or suspected    Preferred imaging location?:   [facility]   POCT urinalysis dipstick   BLADDER SCAN AMB NON-IMAGING   Total time spent caring for the patient today was over 30 minutes. This includes time spent on the date  of the visit reviewing the patient's chart before the visit, time spent during the visit, and time spent after the visit on documentation. Over 50% of that time was spent in face-to-face time with this patient for direct counseling. E&M based on time and complexity of medical decision making.  It has been explained that the patient is to follow regularly with their PCP in addition to all other providers involved in their care and to follow instructions provided by these respective offices. Patient advised to contact urology clinic if any urologic-pertaining questions, concerns, new symptoms or problems arise in the interim period.  There are no Patient Instructions on file for this visit.  Electronically signed by:  Donnita Falls, FNP   03/27/24    2:31 PM

## 2024-04-01 ENCOUNTER — Telehealth: Payer: Self-pay

## 2024-04-01 NOTE — Telephone Encounter (Signed)
 [employer] pick up on 03/31/24 for MDX  [id number]

## 2024-04-03 ENCOUNTER — Other Ambulatory Visit: Payer: Self-pay | Admitting: Urology

## 2024-04-03 ENCOUNTER — Other Ambulatory Visit (HOSPITAL_COMMUNITY): Payer: Self-pay

## 2024-04-03 ENCOUNTER — Encounter: Payer: Self-pay | Admitting: Oncology

## 2024-04-03 DIAGNOSIS — R8279 Other abnormal findings on microbiological examination of urine: Secondary | ICD-10-CM

## 2024-04-03 DIAGNOSIS — N390 Urinary tract infection, site not specified: Secondary | ICD-10-CM

## 2024-04-03 DIAGNOSIS — Z1624 Resistance to multiple antibiotics: Secondary | ICD-10-CM

## 2024-04-03 MED ORDER — CIPROFLOXACIN HCL 500 MG PO TABS
500.0000 mg | ORAL_TABLET | Freq: Two times a day (BID) | ORAL | 0 refills | Status: DC
  Filled 2024-04-03: qty 14, 7d supply, fill #0

## 2024-04-03 NOTE — Progress Notes (Signed)
 MDX urine culture positive for >10k E. Coli (MDRO).   Resistant to: - penicillins - cephalosporins - Nitrofurantoin  - Fosfomycin  Intermediate response to Doxycycline  (with which she was recently treated).  Based on culture sensitivity report will treat with Cipro  500 mg 2x/day for 7 days. Will advise repeat MDX urine culture 1 week after therapy completion if symptoms persist.  Alison Lederer, MSN, FNP-C, [facility] Nurse Practitioner [facility] [place]

## 2024-04-06 ENCOUNTER — Ambulatory Visit (HOSPITAL_COMMUNITY)
Admission: RE | Admit: 2024-04-06 | Discharge: 2024-04-06 | Disposition: A | Discharge status: Home / Self Care | Source: Ambulatory Visit | Attending: Urology | Admitting: Urology

## 2024-04-06 DIAGNOSIS — R8290 Unspecified abnormal findings in urine: Secondary | ICD-10-CM | POA: Diagnosis present

## 2024-04-06 DIAGNOSIS — Z87898 Personal history of other specified conditions: Secondary | ICD-10-CM | POA: Diagnosis present

## 2024-04-06 DIAGNOSIS — R3915 Urgency of urination: Secondary | ICD-10-CM | POA: Diagnosis present

## 2024-04-06 DIAGNOSIS — R300 Dysuria: Secondary | ICD-10-CM | POA: Diagnosis present

## 2024-04-06 DIAGNOSIS — R3989 Other symptoms and signs involving the genitourinary system: Secondary | ICD-10-CM | POA: Insufficient documentation

## 2024-04-07 ENCOUNTER — Other Ambulatory Visit: Payer: Self-pay

## 2024-04-07 ENCOUNTER — Other Ambulatory Visit (HOSPITAL_COMMUNITY): Payer: Self-pay

## 2024-04-07 DIAGNOSIS — N390 Urinary tract infection, site not specified: Secondary | ICD-10-CM

## 2024-04-07 DIAGNOSIS — Z1624 Resistance to multiple antibiotics: Secondary | ICD-10-CM

## 2024-04-07 DIAGNOSIS — R8279 Other abnormal findings on microbiological examination of urine: Secondary | ICD-10-CM

## 2024-04-07 MED ORDER — CIPROFLOXACIN HCL 500 MG PO TABS: 500.0000 mg | ORAL_TABLET | Freq: Two times a day (BID) | ORAL | 0 refills | Status: AC

## 2024-04-07 NOTE — Telephone Encounter (Signed)
 Pharmacy edited and resent

## 2024-04-14 ENCOUNTER — Encounter

## 2024-04-14 ENCOUNTER — Telehealth: Admitting: Family Medicine

## 2024-04-14 DIAGNOSIS — T3695XA Adverse effect of unspecified systemic antibiotic, initial encounter: Secondary | ICD-10-CM

## 2024-04-14 DIAGNOSIS — B379 Candidiasis, unspecified: Secondary | ICD-10-CM

## 2024-04-14 MED ORDER — FLUCONAZOLE 150 MG PO TABS: 150.0000 mg | ORAL_TABLET | ORAL | 0 refills | Status: DC

## 2024-04-14 NOTE — Progress Notes (Signed)

## 2024-04-17 ENCOUNTER — Ambulatory Visit: Admitting: Family Medicine

## 2024-04-24 ENCOUNTER — Ambulatory Visit (INDEPENDENT_AMBULATORY_CARE_PROVIDER_SITE_OTHER): Admitting: Nurse Practitioner

## 2024-04-24 ENCOUNTER — Encounter: Payer: Self-pay | Admitting: Nurse Practitioner

## 2024-04-24 VITALS — BP 162/115 | HR 94 | Temp 97.4°F | Ht 67.0 in | Wt 294.0 lb

## 2024-04-24 DIAGNOSIS — B3731 Acute candidiasis of vulva and vagina: Secondary | ICD-10-CM | POA: Diagnosis not present

## 2024-04-24 DIAGNOSIS — E282 Polycystic ovarian syndrome: Secondary | ICD-10-CM | POA: Diagnosis not present

## 2024-04-24 NOTE — Patient Instructions (Addendum)
 Use boric acid vaginal 600 mg once every 7-14 days as needed for yeast infection  Boric Acid Vaginal Suppositories What is this medication? BORIC ACID (BOHR ik AS id) may support vaginal health. It may relieve the symptoms of a yeast infection, such as itching, burning, and odor. This medicine may be used for other purposes; ask your health care provider or pharmacist if you have questions. COMMON BRAND NAME(S): AZO Boric Acid with Aloe Vera, Hylafem What should I tell my care team before I take this medication? They need to know if you have any of these conditions: Diabetes Frequent infections HIV or AIDS Immune system problems An unusual or allergic reaction to boric acid, other medications, foods, dyes, or preservatives Pregnant or trying to get pregnant Breast-feeding How should I use this medication? This medication is for use in the vagina. Do not take by mouth. Follow the directions on the prescription label. Read package directions carefully before using. Wash hands before and after use. Use this medication at bedtime, unless otherwise directed by your care team. Do not use your medication more often than directed. Do not stop using this medication except on your care team's advice. Talk to your care team about the use of this medication in children. This medication is not approved for use in children. Overdosage: If you think you have taken too much of this medicine contact a poison control center or emergency room at once. NOTE: This medicine is only for you. Do not share this medicine with others. What if I miss a dose? If you miss a dose, use it as soon as you can. If it is almost time for your next dose, use only that dose. Do not use double or extra doses. What may interact with this medication? Interactions are not expected. Do not use any other vaginal products without telling your care team. This list may not describe all possible interactions. Give your health care provider  a list of all the medicines, herbs, non-prescription drugs, or dietary supplements you use. Also tell them if you smoke, drink alcohol, or use illegal drugs. Some items may interact with your medicine. What should I watch for while using this medication? Tell your care team if your symptoms do not start to get better within a few days. It is better not to have sex until you have finished your treatment. This medication may cause condoms, diaphragms, and spermicides to not work as well. Do not rely on any of these methods to prevent sexually transmitted infections (STIs) or pregnancy while you are using this medication. Vaginal medications may come out of the vagina during treatment. To keep the medication from getting on your clothing, wear a panty liner. The use of tampons is not recommended. To help clear up the infection, wear freshly washed cotton, not synthetic, underwear. What side effects may I notice from receiving this medication? Side effects that you should report to your care team as soon as possible: Allergic reactions--skin rash, itching, hives, swelling of the face, lips, tongue, or throat Unusual vaginal discharge, itching, or odor Side effects that usually do not require medical attention (report to your care team if they continue or are bothersome): Vaginal irritation at the application site This list may not describe all possible side effects. Call your doctor for medical advice about side effects. You may report side effects to FDA at 1-800-FDA-1088. Where should I keep my medication? Keep out of the reach of children and pets. Store in a cool, dry  place between 15 and 30 degrees C (59 and 86 degrees F). Keep away from sunlight. Throw away any unused medication after the expiration date. NOTE: This sheet is a summary. It may not cover all possible information. If you have questions about this medicine, talk to your doctor, pharmacist, or health care provider.  2024 Elsevier/Gold  Standard (2021-11-20 00:00:00)Vaginal Yeast Infection, Adult  Vaginal yeast infection is a condition that causes vaginal discharge as well as soreness, swelling, and redness (inflammation) of the vagina. This is a common condition. Some women get this infection frequently. What are the causes? This condition is caused by a change in the normal balance of the yeast (Candida) and normal bacteria that live in the vagina. This change causes an overgrowth of yeast, which causes the inflammation. What increases the risk? The condition is more likely to develop in women who: Take antibiotic medicines. Have diabetes. Take birth control pills. Are pregnant. Douche often. Have a weak body defense system (immune system). Have been taking steroid medicines for a long time. Frequently wear tight clothing. What are the signs or symptoms? Symptoms of this condition include: White, thick, creamy vaginal discharge. Swelling, itching, redness, and irritation of the vagina. The lips of the vagina (labia) may be affected as well. Pain or a burning feeling while urinating. Pain during sex. How is this diagnosed? This condition is diagnosed based on: Your medical history. A physical exam. A pelvic exam. Your health care provider will examine a sample of your vaginal discharge under a microscope. Your health care provider may send this sample for testing to confirm the diagnosis. How is this treated? This condition is treated with medicine. Medicines may be over-the-counter or prescription. You may be told to use one or more of the following: Medicine that is taken by mouth (orally). Medicine that is applied as a cream (topically). Medicine that is inserted directly into the vagina (suppository). Follow these instructions at home: Take or apply over-the-counter and prescription medicines only as told by your health care provider. Do not use tampons until your health care provider approves. Do not have sex  until your infection has cleared. Sex can prolong or worsen your symptoms of infection. Ask your health care provider when it is safe to resume sexual activity. Keep all follow-up visits. This is important. How is this prevented?  Do not wear tight clothes, such as pantyhose or tight pants. Wear breathable cotton underwear. Do not use douches, perfumed soap, creams, or powders. Wipe from front to back after using the toilet. If you have diabetes, keep your blood sugar levels under control. Ask your health care provider for other ways to prevent yeast infections. Contact a health care provider if: You have a fever. Your symptoms go away and then return. Your symptoms do not get better with treatment. Your symptoms get worse. You have new symptoms. You develop blisters in or around your vagina. You have blood coming from your vagina and it is not your menstrual period. You develop pain in your abdomen. Summary Vaginal yeast infection is a condition that causes discharge as well as soreness, swelling, and redness (inflammation) of the vagina. This condition is treated with medicine. Medicines may be over-the-counter or prescription. Take or apply over-the-counter and prescription medicines only as told by your health care provider. Do not douche. Resume sexual activity or use of tampons as instructed by your health care provider. Contact a health care provider if your symptoms do not get better with treatment or your  symptoms go away and then return. This information is not intended to replace advice given to you by your health care provider. Make sure you discuss any questions you have with your health care provider. Document Revised: 02/20/2021 Document Reviewed: 02/20/2021 Elsevier Patient Education  2024 [employer].

## 2024-04-25 ENCOUNTER — Encounter: Payer: Self-pay | Admitting: Nurse Practitioner

## 2024-04-25 LAB — HEMOGLOBIN A1C
Est. average glucose Bld gHb Est-mCnc: 97 mg/dL
Hgb A1c MFr Bld: 5 % (ref 4.8–5.6)

## 2024-04-25 NOTE — Progress Notes (Signed)
   Subjective:    Patient ID: Alison Bass, female    DOB: 05/05/2001, 23 y.o.   MRN: [id number]  HPI Presents to discuss recurrent yeast infections.  Denies any problems today.  Is currently on her cycle.  Last seen for this on 03/17/2024 with a vaginal culture done that showed candidiasis but all other testing was negative.  Same sexual partner for the past 2 years.  STD testing was negative.  Patient has been on rounds of antibiotics for different reasons, especially through urology.  Has responded well to doses of fluconazole  150 mg in the past.  Has a history of PCOS.  Her current oral contraceptive is working well for her cycles.  States she has some mild itching for a day or so when her cycle begins but this resolves.  Has been using a wipe on the outside of the vaginal area that has boric acid in it.   Review of Systems  Constitutional:  Negative for fever.  Respiratory:  Negative for cough, chest tightness, shortness of breath and wheezing.   Cardiovascular:  Negative for chest pain.  Genitourinary:  Positive for vaginal bleeding. Negative for genital sores, menstrual problem and pelvic pain.       On her cycle today.       Objective:   Physical Exam Vitals reviewed.  Constitutional:      General: She is not in acute distress. Cardiovascular:     Rate and Rhythm: Normal rate and regular rhythm.  Pulmonary:     Effort: Pulmonary effort is normal.     Breath sounds: Normal breath sounds.  Neurological:     Mental Status: She is alert.  Psychiatric:        Mood and Affect: Mood normal.        Behavior: Behavior normal.        Thought Content: Thought content normal.     Today's Vitals   04/24/24 1014  BP: (!) 162/115  Pulse: 94  Temp: (!) 97.4 F (36.3 C)  SpO2: 97%  Weight: 294 lb (133.4 kg)  Height: 5\' 7"  (1.702 m)   Body mass index is 46.05 kg/m.        Assessment & Plan:   Problem List Items Addressed This Visit       Endocrine   PCOS  (polycystic ovarian syndrome)   Relevant Orders   Hemoglobin A1c (Completed)     Genitourinary   Recurrent candidiasis of vagina - Primary   Discussed several factors related to recurrent yeast infections.  Will obtain hemoglobin A1c as a precaution particularly due to her PCOS and weight. Discussed preventive measures.  Patient to let us  know if she continues to have recurrent symptoms, consider Terazol 7 cream with persistent infection.  Also recommend boric acid vaginally as directed for prevention.  Patient understands this is for vaginal use only. Call back if symptoms worsen or persist.

## 2024-05-15 ENCOUNTER — Telehealth: Admitting: Physician Assistant

## 2024-05-15 DIAGNOSIS — B3731 Acute candidiasis of vulva and vagina: Secondary | ICD-10-CM | POA: Diagnosis not present

## 2024-05-15 MED ORDER — FLUCONAZOLE 150 MG PO TABS: 150.0000 mg | ORAL_TABLET | ORAL | 0 refills | Status: DC | PRN

## 2024-05-15 NOTE — Progress Notes (Signed)

## 2024-05-20 ENCOUNTER — Other Ambulatory Visit: Admitting: Urology

## 2024-05-21 ENCOUNTER — Inpatient Hospital Stay: Attending: Oncology

## 2024-05-21 DIAGNOSIS — N390 Urinary tract infection, site not specified: Secondary | ICD-10-CM | POA: Diagnosis not present

## 2024-05-21 DIAGNOSIS — D72829 Elevated white blood cell count, unspecified: Secondary | ICD-10-CM | POA: Diagnosis not present

## 2024-05-21 DIAGNOSIS — D509 Iron deficiency anemia, unspecified: Secondary | ICD-10-CM | POA: Diagnosis present

## 2024-05-21 DIAGNOSIS — D75839 Thrombocytosis, unspecified: Secondary | ICD-10-CM | POA: Insufficient documentation

## 2024-05-21 DIAGNOSIS — E538 Deficiency of other specified B group vitamins: Secondary | ICD-10-CM | POA: Insufficient documentation

## 2024-05-21 DIAGNOSIS — Z793 Long term (current) use of hormonal contraceptives: Secondary | ICD-10-CM | POA: Diagnosis not present

## 2024-05-21 DIAGNOSIS — D72825 Bandemia: Secondary | ICD-10-CM

## 2024-05-21 DIAGNOSIS — N920 Excessive and frequent menstruation with regular cycle: Secondary | ICD-10-CM | POA: Insufficient documentation

## 2024-05-21 LAB — COMPREHENSIVE METABOLIC PANEL WITH GFR
ALT: 31 U/L (ref 0–44)
AST: 24 U/L (ref 15–41)
Albumin: 3.2 g/dL — ABNORMAL LOW (ref 3.5–5.0)
Alkaline Phosphatase: 93 U/L (ref 38–126)
Anion gap: 10 (ref 5–15)
BUN: 9 mg/dL (ref 6–20)
CO2: 27 mmol/L (ref 22–32)
Calcium: 8.9 mg/dL (ref 8.9–10.3)
Chloride: 105 mmol/L (ref 98–111)
Creatinine, Ser: 0.74 mg/dL (ref 0.44–1.00)
GFR, Estimated: >60 mL/min (ref >60)
Glucose, Bld: 80 mg/dL (ref 70–99)
Potassium: 3.9 mmol/L (ref 3.5–5.1)
Sodium: 142 mmol/L (ref 135–145)
Total Bilirubin: 0.4 mg/dL (ref 0.0–1.2)
Total Protein: 7.4 g/dL (ref 6.5–8.1)

## 2024-05-21 LAB — CBC WITH DIFFERENTIAL/PLATELET
Abs Immature Granulocytes: 0.07 10*3/uL (ref 0.00–0.07)
Basophils Absolute: 0.1 10*3/uL (ref 0.0–0.1)
Basophils Relative: 1 %
Eosinophils Absolute: 0.2 10*3/uL (ref 0.0–0.5)
Eosinophils Relative: 1 %
HCT: 44.2 % (ref 36.0–46.0)
Hemoglobin: 14.7 g/dL (ref 12.0–15.0)
Immature Granulocytes: 1 %
Lymphocytes Relative: 15 %
Lymphs Abs: 2.2 10*3/uL (ref 0.7–4.0)
MCH: 28.7 pg (ref 26.0–34.0)
MCHC: 33.3 g/dL (ref 30.0–36.0)
MCV: 86.2 fL (ref 80.0–100.0)
Monocytes Absolute: 0.9 10*3/uL (ref 0.1–1.0)
Monocytes Relative: 6 %
Neutro Abs: 11.4 10*3/uL — ABNORMAL HIGH (ref 1.7–7.7)
Neutrophils Relative %: 76 %
Platelets: 394 10*3/uL (ref 150–400)
RBC: 5.13 MIL/uL — ABNORMAL HIGH (ref 3.87–5.11)
RDW: 13.2 % (ref 11.5–15.5)
WBC: 14.8 10*3/uL — ABNORMAL HIGH (ref 4.0–10.5)
nRBC: 0 % (ref 0.0–0.2)

## 2024-05-21 LAB — IRON AND TIBC
Iron: 34 ug/dL (ref 28–170)
Saturation Ratios: 9 % — ABNORMAL LOW (ref 10.4–31.8)
TIBC: 374 ug/dL (ref 250–450)
UIBC: 340 ug/dL

## 2024-05-21 LAB — VITAMIN B12: Vitamin B-12: 429 pg/mL (ref 180–914)

## 2024-05-21 LAB — FOLATE: Folate: 9.6 ng/mL (ref >5.9)

## 2024-05-21 LAB — FERRITIN: Ferritin: 209 ng/mL (ref 11–307)

## 2024-05-28 ENCOUNTER — Inpatient Hospital Stay: Admitting: Oncology

## 2024-05-28 VITALS — BP 132/92 | Temp 98.8°F | Resp 20 | Wt 296.0 lb

## 2024-05-28 DIAGNOSIS — D72825 Bandemia: Secondary | ICD-10-CM

## 2024-05-28 DIAGNOSIS — D509 Iron deficiency anemia, unspecified: Secondary | ICD-10-CM | POA: Diagnosis not present

## 2024-05-28 DIAGNOSIS — D500 Iron deficiency anemia secondary to blood loss (chronic): Secondary | ICD-10-CM | POA: Diagnosis not present

## 2024-05-28 DIAGNOSIS — D75839 Thrombocytosis, unspecified: Secondary | ICD-10-CM

## 2024-05-28 DIAGNOSIS — N920 Excessive and frequent menstruation with regular cycle: Secondary | ICD-10-CM

## 2024-05-28 DIAGNOSIS — E538 Deficiency of other specified B group vitamins: Secondary | ICD-10-CM

## 2024-05-28 NOTE — Assessment & Plan Note (Signed)
 Patient has moderate vitamin B12 deficiency. Improved with oral B12 supplementation  -Continue cyanocobalamin  1000 mcg daily.

## 2024-05-28 NOTE — Assessment & Plan Note (Signed)
 Likely reactive secondary to iron deficiency. Resolved at this time  -Continue oral iron supplementation -Continue to monitor levels

## 2024-05-28 NOTE — Progress Notes (Signed)
 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  Iron levels improved after IV iron infusion with a drop again now and TSAT.   Patient is also started on birth control and has since decreased menstrual bleeding.  -Continue oral iron supplementation at this time every other day -Will do 1 more dose of IV iron to replenish iron stores -Encouraged to eat healthy diet with increased protein and green leafy vegetables  Return to clinic in 2 months with labs  Leukocytosis Leukocytosis improved white blood cell count.  Likely reactive or secondary to chronic UTI and obesity.  Patient also reports recent stressful events in life. 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  RF negative.  ESR normal, CRP increased. ANA: Negative  - Patient is being worked up by urology for chronic UTIs - Continue to monitor counts - No further workup needed at this time  Menorrhagia Heavy menstrual bleeding has improved since starting birth control, but still present. -Continue current birth control regimen.  Thrombocytosis Likely reactive secondary to iron deficiency. Resolved at this time  -Continue oral iron supplementation -Continue to monitor levels  Vitamin B12 deficiency Patient has moderate vitamin B12 deficiency. Improved with oral B12 supplementation  -Continue cyanocobalamin  1000 mcg daily.      Orders Placed This Encounter  Procedures   Ferritin    Standing Status:   Future    Expected Date:   07/27/2024    Expiration Date:   10/25/2024   Folate    Standing Status:   Future    Expected Date:   07/27/2024    Expiration Date:   10/25/2024   Vitamin  B12    Standing Status:   Future    Expected Date:   07/27/2024    Expiration Date:   10/25/2024   CBC with Differential/Platelet    Standing Status:   Future    Expected Date:   07/27/2024    Expiration Date:   10/25/2024   Comprehensive metabolic panel with GFR    Standing Status:   Future    Expected Date:   07/27/2024    Expiration Date:   10/25/2024   Iron and TIBC    Standing Status:   Future    Expected Date:   07/27/2024    Expiration Date:   10/25/2024    The total time spent in the appointment was 20 minutes encounter with patients including review of chart and various tests results, discussions about plan of care and coordination of care plan  All questions were answered. The patient knows to call the clinic with any problems, questions or concerns. No barriers to learning was detected.  Alison Grade, MD 6/12/20252:33 PM    INTERVAL HISTORY: Alison Bass 23 y.o. female with history of menorrhagia following for leukocytosis and iron deficiency anemia secondary to menorrhagia. She reports that her menstrual bleeding has significantly decreased since starting oral contraceptive pills.  She recently started feeling dizzy again and was thinking if her iron deficiency was back.  She also describes that she has some life situations where she is feeling stressful recently but she feels safe.  She is being worked up for chronic UTIs with urology.  She  denies fever, chills, weight loss, loss of appetite, night sweats.  Overall is feeling well.  I have reviewed the past medical history, past surgical history, social history and family history with the patient   ALLERGIES:  is allergic to aloe, cefzil [cefprozil], dimetapp c [phenylephrine-bromphen-codeine], and ferumoxytol .  MEDICATIONS:  Current Outpatient Medications  Medication Sig Dispense Refill   Adapalene  0.3 % gel Apply a small amount to skin every evening 45 g 3   Clindamycin  Phos-Benzoyl Perox gel Apply a small  amount to affected area every morning 50 g 3   cyanocobalamin  (VITAMIN B12) 1000 MCG tablet Take 1 tablet (1,000 mcg total) by mouth daily. 90 tablet 2   fluconazole  (DIFLUCAN ) 150 MG tablet Take 1 tablet (150 mg total) by mouth every 3 (three) days as needed. 2 tablet 0   metFORMIN  (GLUCOPHAGE ) 500 MG tablet TAKE 1 TABLET BY MOUTH 2 TIMES DAILY WITH A MEAL. 180 tablet 1   norethindrone-ethinyl estradiol -FE (LOESTRIN FE 1/20) 1-20 MG-MCG tablet Take 1 tablet by mouth daily. Start pack on Sunday. 84 tablet 3   oxybutynin  (DITROPAN ) 5 MG tablet Take 1 tablet (5 mg total) by mouth every 8 (eight) hours as needed for bladder spasms. 30 tablet 1   spironolactone  (ALDACTONE ) 25 MG tablet Take 1 tablet (25 mg total) by mouth daily. For acne 90 tablet 3   No current facility-administered medications for this visit.     REVIEW OF SYSTEMS:   Constitutional: Denies fevers, chills or night sweats Eyes: Denies blurriness of vision Ears, nose, mouth, throat, and face: Denies mucositis or sore throat Respiratory: Denies cough, dyspnea or wheezes Cardiovascular: Denies palpitation, chest discomfort or lower extremity swelling Gastrointestinal:  Denies nausea, heartburn or change in bowel habits Skin: Denies abnormal skin rashes Lymphatics: Denies new lymphadenopathy or easy bruising Neurological:Denies numbness, tingling or new weaknesses Behavioral/Psych: Mood is stable, no new changes  All other systems were reviewed with the patient and are negative.  PHYSICAL EXAMINATION:   Vitals:   05/28/24 1403  BP: (!) 132/92  Resp: 20  Temp: 98.8 F (37.1 C)  SpO2: 97%    GENERAL:alert, no distress and comfortable SKIN: skin color, texture, turgor are normal, no rashes or significant lesions LYMPH:  no palpable lymphadenopathy in the cervical, axillary or inguinal LUNGS: clear to auscultation and percussion with normal breathing effort HEART: regular rate & rhythm and no murmurs and no lower  extremity edema ABDOMEN:abdomen soft, non-tender and normal bowel sounds Musculoskeletal:no cyanosis of digits and no clubbing  NEURO: alert & oriented x 3 with fluent speech  LABORATORY DATA:  I have reviewed the data as listed  Lab Results  Component Value Date   WBC 14.8 (H) 05/21/2024   NEUTROABS 11.4 (H) 05/21/2024   HGB 14.7 05/21/2024   HCT 44.2 05/21/2024   MCV 86.2 05/21/2024   PLT 394 05/21/2024      Component Value Date/Time   NA 142 05/21/2024 1411   NA 140 02/08/2023 0956   K 3.9 05/21/2024 1411   CL 105 05/21/2024 1411   CO2 27 05/21/2024 1411   GLUCOSE 80 05/21/2024 1411   BUN 9 05/21/2024 1411   BUN 11 02/08/2023 0956   CREATININE 0.74 05/21/2024 1411   CALCIUM 8.9 05/21/2024 1411   PROT 7.4 05/21/2024 1411   PROT 7.3 02/08/2023 0956   ALBUMIN 3.2 (L) 05/21/2024 1411   ALBUMIN 4.2 02/08/2023 0956   Bass 24 05/21/2024 1411   ALT 31 05/21/2024 1411   ALKPHOS 93 05/21/2024  1411   BILITOT 0.4 05/21/2024 1411   BILITOT 0.3 02/08/2023 0956   GFRNONAA >60 05/21/2024 1411       Chemistry      Component Value Date/Time   NA 142 05/21/2024 1411   NA 140 02/08/2023 0956   K 3.9 05/21/2024 1411   CL 105 05/21/2024 1411   CO2 27 05/21/2024 1411   BUN 9 05/21/2024 1411   BUN 11 02/08/2023 0956   CREATININE 0.74 05/21/2024 1411      Component Value Date/Time   CALCIUM 8.9 05/21/2024 1411   ALKPHOS 93 05/21/2024 1411   Bass 24 05/21/2024 1411   ALT 31 05/21/2024 1411   BILITOT 0.4 05/21/2024 1411   BILITOT 0.3 02/08/2023 0956      Latest Reference Range & Units 05/21/24 14:10  Iron 28 - 170 ug/dL 34  UIBC ug/dL 161  TIBC 096 - 045 ug/dL 409  Saturation Ratios 10.4 - 31.8 % 9 (L)  Ferritin 11 - 307 ng/mL 209  Folate >5.9 ng/mL 9.6  Vitamin B12 180 - 914 pg/mL 429  (L): Data is abnormally low   Latest Reference Range & Units 11/20/23 15:33  Sed Rate 0 - 22 mm/hr 17    Latest Reference Range & Units 11/20/23 15:33  CRP <1.0 mg/dL 3.0 (H)  (H):  Data is abnormally high   Latest Reference Range & Units 02/20/24 13:58  RA Latex Turbid. <14.0 IU/mL <10.0   Flow cytometry: 11/20/2023: DIAGNOSIS:   -Slight increase in gamma delta T cells.  -No monoclonal B-cell population identified  -See comment   COMMENT:   Flow cytometric analysis of the lymphoid population shows predominance of T cells (13% of all white blood cells) displaying expression of pan T-cell antigens with normal CD4:CD8 ratio.  However, there is slight increase in the proportion of T cells with gamma delta phenotype (11% of T cells).  B cells represent a minor population with no monoclonality. The T-cell changes are limited and likely represent secondary changes in most settings due infection, connective tissue disease, immune suppression/dysregulation.  Clinical correlation and follow-up are recommended.

## 2024-05-28 NOTE — Assessment & Plan Note (Signed)
 Heavy menstrual bleeding has improved since starting birth control, but still present. -Continue current birth control regimen.

## 2024-05-28 NOTE — Assessment & Plan Note (Signed)
 Iron deficiency anemia likely secondary to menorrhagia.  Iron levels improved after IV iron infusion with a drop again now and TSAT.   Patient is also started on birth control and has since decreased menstrual bleeding.  -Continue oral iron supplementation at this time every other day -Will do 1 more dose of IV iron to replenish iron stores -Encouraged to eat healthy diet with increased protein and green leafy vegetables  Return to clinic in 2 months with labs

## 2024-05-28 NOTE — Assessment & Plan Note (Signed)
 Leukocytosis improved white blood cell count.  Likely reactive or secondary to chronic UTI and obesity.  Patient also reports recent stressful events in life. 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  RF negative.  ESR normal, CRP increased. ANA: Negative  - Patient is being worked up by urology for chronic UTIs - Continue to monitor counts - No further workup needed at this time

## 2024-06-08 ENCOUNTER — Inpatient Hospital Stay

## 2024-06-08 VITALS — BP 126/71 | HR 73 | Temp 98.3°F | Resp 19

## 2024-06-08 DIAGNOSIS — D509 Iron deficiency anemia, unspecified: Secondary | ICD-10-CM | POA: Diagnosis not present

## 2024-06-08 DIAGNOSIS — D500 Iron deficiency anemia secondary to blood loss (chronic): Secondary | ICD-10-CM

## 2024-06-08 MED ORDER — FAMOTIDINE IN NACL 20-0.9 MG/50ML-% IV SOLN
20.0000 mg | Freq: Once | INTRAVENOUS | Status: AC
  Administered 2024-06-08: 20 mg via INTRAVENOUS
  Filled 2024-06-08: qty 50

## 2024-06-08 MED ORDER — METHYLPREDNISOLONE SODIUM SUCC 125 MG IJ SOLR
125.0000 mg | Freq: Once | INTRAMUSCULAR | Status: AC
  Administered 2024-06-08: 125 mg via INTRAVENOUS
  Filled 2024-06-08: qty 2

## 2024-06-08 MED ORDER — ACETAMINOPHEN 325 MG PO TABS
650.0000 mg | ORAL_TABLET | Freq: Once | ORAL | Status: AC
  Administered 2024-06-08: 650 mg via ORAL
  Filled 2024-06-08: qty 2

## 2024-06-08 MED ORDER — SODIUM CHLORIDE 0.9 % IV SOLN: INTRAVENOUS | Status: DC

## 2024-06-08 MED ORDER — CETIRIZINE HCL 10 MG PO TABS
10.0000 mg | ORAL_TABLET | Freq: Once | ORAL | Status: AC
  Administered 2024-06-08: 10 mg via ORAL
  Filled 2024-06-08: qty 1

## 2024-06-08 MED ORDER — SODIUM CHLORIDE 0.9 % IV SOLN
510.0000 mg | Freq: Once | INTRAVENOUS | Status: AC
  Administered 2024-06-08: 510 mg via INTRAVENOUS
  Filled 2024-06-08: qty 510

## 2024-06-08 NOTE — Progress Notes (Signed)
Patient presents today for Feraheme infusion. Vital signs stable. Patient denies any side effects related to last iron infusion. MAR reviewed and updated.   Feraheme given today per MD orders. Tolerated infusion without adverse affects. Vital signs stable. No complaints at this time. Discharged from clinic ambulatory in stable condition. Alert and oriented x 3. F/U with [facility] as scheduled.

## 2024-06-08 NOTE — Patient Instructions (Signed)
 [facility] [place] - A DEPT OF [facility]  Discharge Instructions: Thank you for choosing [facility] to provide your oncology and hematology care.  If you have a lab appointment with the Cancer Center - please note that after April 8th, 2024, all labs will be drawn in the cancer center.  You do not have to check in or register with the main entrance as you have in the past but will complete your check-in in the cancer center.  Wear comfortable clothing and clothing appropriate for easy access to any Portacath or PICC line.   We strive to give you quality time with your provider. You may need to reschedule your appointment if you arrive late (15 or more minutes).  Arriving late affects you and other patients whose appointments are after yours.  Also, if you miss three or more appointments without notifying the office, you may be dismissed from the clinic at the provider's discretion.      For prescription refill requests, have your pharmacy contact our office and allow 72 hours for refills to be completed.    Today you received the following chemotherapy and/or immunotherapy agents Feraheme. Ferumoxytol Injection What is this medication? FERUMOXYTOL (FER ue MOX i tol) treats low levels of iron in your body (iron deficiency anemia). Iron is a mineral that plays an important role in making red blood cells, which carry oxygen from your lungs to the rest of your body. This medicine may be used for other purposes; ask your health care provider or pharmacist if you have questions. COMMON BRAND NAME(S): Feraheme What should I tell my care team before I take this medication? They need to know if you have any of these conditions: Anemia not caused by low iron levels High levels of iron in the blood Magnetic resonance imaging (MRI) test scheduled An unusual or allergic reaction to iron, other medications, foods, dyes, or preservatives Pregnant or trying to get  pregnant Breastfeeding How should I use this medication? This medication is injected into a vein. It is given by your care team in a hospital or clinic setting. Talk to your care team the use of this medication in children. Special care may be needed. Overdosage: If you think you have taken too much of this medicine contact a poison control center or emergency room at once. NOTE: This medicine is only for you. Do not share this medicine with others. What if I miss a dose? It is important not to miss your dose. Call your care team if you are unable to keep an appointment. What may interact with this medication? Other iron products This list may not describe all possible interactions. Give your health care provider a list of all the medicines, herbs, non-prescription drugs, or dietary supplements you use. Also tell them if you smoke, drink alcohol, or use illegal drugs. Some items may interact with your medicine. What should I watch for while using this medication? Visit your care team for regular checks on your progress. Tell your care team if your symptoms do not start to get better or if they get worse. You may need blood work done while you are taking this medication. You may need to eat more foods that contain iron. Talk to your care team. Foods that contain iron include whole grains or cereals, dried fruits, beans, peas, leafy green vegetables, and organ meats (liver, kidney). What side effects may I notice from receiving this medication? Side effects that  you should report to your care team as soon as possible: Allergic reactions--skin rash, itching, hives, swelling of the face, lips, tongue, or throat Low blood pressure--dizziness, feeling faint or lightheaded, blurry vision Shortness of breath Side effects that usually do not require medical attention (report to your care team if they continue or are bothersome): Flushing Headache Joint pain Muscle pain Nausea Pain, redness, or  irritation at injection site This list may not describe all possible side effects. Call your doctor for medical advice about side effects. You may report side effects to FDA at 1-800-FDA-1088. Where should I keep my medication? This medication is given in a hospital or clinic. It will not be stored at home. NOTE: This sheet is a summary. It may not cover all possible information. If you have questions about this medicine, talk to your doctor, pharmacist, or health care provider.  2024 Elsevier/Gold Standard (2023-07-24 00:00:00)      To help prevent nausea and vomiting after your treatment, we encourage you to take your nausea medication as directed.  BELOW ARE SYMPTOMS THAT SHOULD BE REPORTED IMMEDIATELY: *FEVER GREATER THAN 100.4 F (38 C) OR HIGHER *CHILLS OR SWEATING *NAUSEA AND VOMITING THAT IS NOT CONTROLLED WITH YOUR NAUSEA MEDICATION *UNUSUAL SHORTNESS OF BREATH *UNUSUAL BRUISING OR BLEEDING *URINARY PROBLEMS (pain or burning when urinating, or frequent urination) *BOWEL PROBLEMS (unusual diarrhea, constipation, pain near the anus) TENDERNESS IN MOUTH AND THROAT WITH OR WITHOUT PRESENCE OF ULCERS (sore throat, sores in mouth, or a toothache) UNUSUAL RASH, SWELLING OR PAIN  UNUSUAL VAGINAL DISCHARGE OR ITCHING   Items with * indicate a potential emergency and should be followed up as soon as possible or go to the Emergency Department if any problems should occur.  Please show the CHEMOTHERAPY ALERT CARD or IMMUNOTHERAPY ALERT CARD at check-in to the Emergency Department and triage nurse.  Should you have questions after your visit or need to cancel or reschedule your appointment, please contact [facility] [place] - A DEPT OF Eligha Bridegroom [facility] [phone]  and follow the prompts.  Office hours are 8:00 a.m. to 4:30 p.m. Monday - Friday. Please note that voicemails left after 4:00 p.m. may not be returned until the following business day.  We are closed weekends  and major holidays. You have access to a nurse at all times for urgent questions. Please call the main number to the clinic [phone] and follow the prompts.  For any non-urgent questions, you may also contact your provider using MyChart. We now offer e-Visits for anyone 67 and older to request care online for non-urgent symptoms. For details visit [email or web address].   Also download the MyChart app! Go to the app store, search "MyChart", open the app, select [place], and log in with your MyChart username and password.

## 2024-06-09 ENCOUNTER — Ambulatory Visit: Admitting: Nurse Practitioner

## 2024-06-09 ENCOUNTER — Encounter: Payer: Self-pay | Admitting: Nurse Practitioner

## 2024-06-09 VITALS — BP 129/74 | HR 77 | Temp 98.8°F | Ht 67.0 in | Wt 298.0 lb

## 2024-06-09 DIAGNOSIS — N342 Other urethritis: Secondary | ICD-10-CM | POA: Diagnosis not present

## 2024-06-09 DIAGNOSIS — N390 Urinary tract infection, site not specified: Secondary | ICD-10-CM

## 2024-06-09 DIAGNOSIS — B3731 Acute candidiasis of vulva and vagina: Secondary | ICD-10-CM | POA: Insufficient documentation

## 2024-06-09 LAB — POCT WET PREP WITH KOH
Clue Cells Wet Prep HPF POC: NEGATIVE
RBC Wet Prep HPF POC: NEGATIVE
Trichomonas, UA: NEGATIVE
pH, Wet Prep: 4.5

## 2024-06-09 MED ORDER — FLUCONAZOLE 150 MG PO TABS: ORAL_TABLET | ORAL | 0 refills | Status: DC

## 2024-06-09 NOTE — Progress Notes (Signed)
 Subjective:    Patient ID: Alison Bass, female    DOB: [birth year], 23 y.o.   MRN: [id number]  HPI  Patient states has reocurrent vaginitis or yeast infections Currently has white discharge, swelling outside vaginal area, pain and irritation when wiping  Requests new referral request to urology in [place] Vaginal discharge has no color or odor.  Same female sexual partner.  Uses condoms consistently.  Has been using the same brand for several years.  Has not tried any OTC products for her yeast infection.  States in the past Diflucan  completely resolved her symptoms.  Has not been on antibiotics for weeks.  Continues follow-up with urology but would like to see a female provider if she has to do any procedures.  Has regular normal cycles with her birth control.  Her next one is due within the next 1 to 2 weeks.  Review of Systems  Respiratory:  Negative for cough, chest tightness and shortness of breath.   Cardiovascular:  Negative for chest pain.  Genitourinary:  Positive for dysuria and vaginal discharge. Negative for enuresis, menstrual problem and pelvic pain.       Dysuria mainly due to irritation externally.  Due to dyspareunia but patient has not had intercourse in a couple of weeks or more.       Objective:   Physical Exam Vitals and nursing note reviewed. Chaperone present: Defers chaperone..  Constitutional:      General: She is not in acute distress.  Cardiovascular:     Rate and Rhythm: Normal rate and regular rhythm.  Pulmonary:     Effort: Pulmonary effort is normal.     Breath sounds: Normal breath sounds.  Abdominal:     General: There is no distension.     Tenderness: There is no abdominal tenderness. There is no guarding or rebound.  Genitourinary:    Comments: Faint erythema along the labia majora.  Moderate erythema along the vulvar area.  Vaginal area mild irritation with a moderate amount of white mucoid discharge.  No lesions noted.  Neurological:      Mental Status: She is alert.   Psychiatric:        Mood and Affect: Mood normal.        Behavior: Behavior normal.        Thought Content: Thought content normal.        Judgment: Judgment normal.    Today's Vitals   06/09/24 1455  BP: 129/74  Pulse: 77  Temp: 98.8 F (37.1 C)  SpO2: 97%  Weight: 298 lb (135.2 kg)  Height: 5' 7 (1.702 m)   Body mass index is 46.67 kg/m.  Hemoglobin A1c on 04/24/2024 was 5.0%       Assessment & Plan:   Problem List Items Addressed This Visit       Genitourinary   Recurrent candidiasis of vagina - Primary   Relevant Medications   fluconazole  (DIFLUCAN ) 150 MG tablet   Other Relevant Orders   POCT Wet Prep with KOH (Completed)   Ambulatory referral to Urogynecology   Vulvovaginal candidiasis   Relevant Medications   fluconazole  (DIFLUCAN ) 150 MG tablet   Other Relevant Orders   Ambulatory referral to Urogynecology   Other Visit Diagnoses       Recurrent UTI       Relevant Medications   fluconazole  (DIFLUCAN ) 150 MG tablet   Other Relevant Orders   Ambulatory referral to Urogynecology     Urethritis  Relevant Orders   Ambulatory referral to Urogynecology      Meds ordered this encounter  Medications   fluconazole  (DIFLUCAN ) 150 MG tablet    Sig: Take one tab po every 3 days x 3 doses then once a week    Dispense:  14 tablet    Refill:  0    Supervising Provider:   ALPHONSA HAMILTON A [[id number]]   Since fluconazole  has resolved her symptoms recently, we will try a regular cycle of Diflucan  as directed.  Patient to call back if no improvement. Start OTC vaginal antifungal mixed with a small amount of barrier cream to external GU.  Continue wearing loose pants with no underwear at night. Per patient request, referral sent to urogynecology for evaluation of recurrent urinary symptoms. Patient plans to schedule a physical here in the near future.

## 2024-06-15 ENCOUNTER — Inpatient Hospital Stay

## 2024-06-15 VITALS — BP 124/86 | HR 70 | Temp 97.5°F | Resp 16

## 2024-06-15 DIAGNOSIS — D500 Iron deficiency anemia secondary to blood loss (chronic): Secondary | ICD-10-CM

## 2024-06-15 DIAGNOSIS — D509 Iron deficiency anemia, unspecified: Secondary | ICD-10-CM | POA: Diagnosis not present

## 2024-06-15 MED ORDER — SODIUM CHLORIDE 0.9 % IV SOLN: INTRAVENOUS | Status: DC

## 2024-06-15 MED ORDER — ACETAMINOPHEN 325 MG PO TABS
650.0000 mg | ORAL_TABLET | Freq: Once | ORAL | Status: AC
  Administered 2024-06-15: 650 mg via ORAL
  Filled 2024-06-15: qty 2

## 2024-06-15 MED ORDER — METHYLPREDNISOLONE SODIUM SUCC 125 MG IJ SOLR
125.0000 mg | Freq: Once | INTRAMUSCULAR | Status: AC
  Administered 2024-06-15: 125 mg via INTRAVENOUS
  Filled 2024-06-15: qty 2

## 2024-06-15 MED ORDER — SODIUM CHLORIDE 0.9 % IV SOLN
510.0000 mg | Freq: Once | INTRAVENOUS | Status: AC
  Administered 2024-06-15: 510 mg via INTRAVENOUS
  Filled 2024-06-15: qty 510

## 2024-06-15 MED ORDER — FAMOTIDINE IN NACL 20-0.9 MG/50ML-% IV SOLN
20.0000 mg | Freq: Once | INTRAVENOUS | Status: AC
  Administered 2024-06-15: 20 mg via INTRAVENOUS
  Filled 2024-06-15: qty 50

## 2024-06-15 MED ORDER — CETIRIZINE HCL 10 MG PO TABS
10.0000 mg | ORAL_TABLET | Freq: Once | ORAL | Status: AC
  Administered 2024-06-15: 10 mg via ORAL
  Filled 2024-06-15: qty 1

## 2024-06-15 NOTE — Progress Notes (Signed)
 Feraheme iron infusion given per orders. Patient tolerated it well without problems. Vitals stable and discharged home from clinic ambulatory. Follow up as scheduled.

## 2024-06-15 NOTE — Patient Instructions (Signed)

## 2024-06-17 ENCOUNTER — Other Ambulatory Visit: Admitting: Urology

## 2024-07-17 ENCOUNTER — Ambulatory Visit: Admitting: Physician Assistant

## 2024-07-17 ENCOUNTER — Encounter: Payer: Self-pay | Admitting: Physician Assistant

## 2024-07-17 VITALS — BP 132/86 | HR 81 | Temp 98.3°F | Resp 16 | Ht 67.0 in | Wt 297.0 lb

## 2024-07-17 DIAGNOSIS — Z111 Encounter for screening for respiratory tuberculosis: Secondary | ICD-10-CM

## 2024-07-17 DIAGNOSIS — Z0000 Encounter for general adult medical examination without abnormal findings: Secondary | ICD-10-CM | POA: Diagnosis not present

## 2024-07-17 NOTE — Progress Notes (Signed)
 Complete physical exam  Patient: Alison Bass   DOB: Jan 02, 2001   22 y.o. Female  MRN: [id number]  Subjective:    Chief Complaint  Patient presents with   Annual Exam    Alison Bass is a 23 y.o. female who presents today for a complete physical exam. She reports consuming a general diet. The patient does not participate in regular exercise at present. She generally feels well. She reports sleeping well. She does not have additional problems to discuss today.    Most recent fall risk assessment:    04/24/2024   10:23 AM  Fall Risk   Injury with Fall? 0     Most recent depression screenings:    07/17/2024    9:48 AM 06/15/2024    2:58 PM  PHQ 2/9 Scores  PHQ - 2 Score 0 0     Patient Care Team: Cook, Jayce G, DO as PCP - General (Family Medicine) Dedlow, Maceo SAUNDERS, NP as Nurse Practitioner (Nurse Practitioner)   Outpatient Medications Prior to Visit  Medication Sig   cyanocobalamin  (VITAMIN B12) 1000 MCG tablet Take 1 tablet (1,000 mcg total) by mouth daily.   fluconazole  (DIFLUCAN ) 150 MG tablet Take one tab po every 3 days x 3 doses then once a week   metFORMIN  (GLUCOPHAGE ) 500 MG tablet TAKE 1 TABLET BY MOUTH 2 TIMES DAILY WITH A MEAL.   norethindrone-ethinyl estradiol -FE (LOESTRIN FE 1/20) 1-20 MG-MCG tablet Take 1 tablet by mouth daily. Start pack on Sunday.   oxybutynin  (DITROPAN ) 5 MG tablet Take 1 tablet (5 mg total) by mouth every 8 (eight) hours as needed for bladder spasms.   spironolactone  (ALDACTONE ) 25 MG tablet Take 1 tablet (25 mg total) by mouth daily. For acne   No facility-administered medications prior to visit.    Review of Systems  Constitutional:  Negative for chills, fever and malaise/fatigue.  Eyes:  Negative for blurred vision and double vision.  Respiratory:  Negative for cough and shortness of breath.   Cardiovascular:  Negative for chest pain and palpitations.  Musculoskeletal:  Negative for joint pain and myalgias.   Neurological:  Negative for dizziness and headaches.  Psychiatric/Behavioral:  Negative for depression. The patient is not nervous/anxious.           Objective:     BP 132/86   Pulse 81   Temp 98.3 F (36.8 C)   Resp 16   Ht 5' 7 (1.702 m)   Wt 297 lb (134.7 kg)   BMI 46.52 kg/m   Physical Exam Constitutional:      Appearance: Normal appearance. She is obese.  HENT:     Head: Normocephalic.     Right Ear: Tympanic membrane normal.     Left Ear: Tympanic membrane normal.     Nose: Nose normal.     Mouth/Throat:     Mouth: Mucous membranes are moist.     Pharynx: Oropharynx is clear.  Eyes:     Extraocular Movements: Extraocular movements intact.     Conjunctiva/sclera: Conjunctivae normal.  Neck:     Thyroid: No thyroid mass, thyromegaly or thyroid tenderness.  Cardiovascular:     Rate and Rhythm: Normal rate and regular rhythm.     Heart sounds: Normal heart sounds. No murmur heard. Pulmonary:     Effort: Pulmonary effort is normal.     Breath sounds: Normal breath sounds. No wheezing, rhonchi or rales.  Abdominal:     General: Abdomen is flat. Bowel sounds are  normal.     Palpations: Abdomen is soft.     Tenderness: There is no abdominal tenderness.  Musculoskeletal:     Cervical back: Normal range of motion and neck supple.  Lymphadenopathy:     Cervical: No cervical adenopathy.  Skin:    General: Skin is warm and dry.     Capillary Refill: Capillary refill takes less than 2 seconds.  Neurological:     General: No focal deficit present.     Mental Status: She is alert and oriented to person, place, and time.  Psychiatric:        Mood and Affect: Mood normal.        Behavior: Behavior normal.      No results found for any visits on 07/17/24.    Assessment & Plan:    Routine Health Maintenance and Physical Exam  Health Maintenance  Topic Date Due   Pneumococcal Vaccine for high risk medical condition (1 of 2 - PPSV23, PCV20, or PCV21)  01/22/2005   COVID-19 Vaccine (1) Never done   HPV Vaccine (1 - 3-dose series) Never done   HIV Screening  Never done   Meningitis B Vaccine (1 of 2 - Standard) Never done   Hepatitis C Screening  Never done   Flu Shot  07/17/2024   Chlamydia screening  03/17/2025   DTaP/Tdap/Td vaccine (7 - Td or Tdap) 04/13/2025   Hepatitis B Vaccine  Completed    Discussed health benefits of physical activity, and encouraged her to engage in regular exercise appropriate for her age and condition.  Problem List Items Addressed This Visit   None Visit Diagnoses       Annual visit for general adult medical examination without abnormal findings    -  Primary     Screening for tuberculosis       Relevant Orders   QuantiFERON-TB Gold Plus      Return in about 1 year (around 07/17/2025), or sooner as needed.  Safety measures discussed. 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 Eats plenty of fruits and vegetables.  Recommend 150 minutes per week of exercise such as walking. Recommend lots of fresh produce to include fruits, vegetables, beans, healthy fats such as avocado, nuts, seeds, and 3-6 ounces of protein at each meal.  Avoid fried foods and fast food. Limit alcohol consumption: no more than one drink per day for women and 2 drinks per day for men.  Stress management discussed. Routine vision and dental screening discussed: recommend dentist every 6 months, gets vision checked every 1-2 years.  Health maintenance: Pap due next year.  Questions answered.       Charmaine Dacia Capers, PA-C

## 2024-07-22 ENCOUNTER — Ambulatory Visit: Payer: Self-pay | Admitting: Physician Assistant

## 2024-07-22 LAB — QUANTIFERON-TB GOLD PLUS
QuantiFERON Mitogen Value: >10 [IU]/mL
QuantiFERON Nil Value: 0.05 [IU]/mL
QuantiFERON TB1 Ag Value: 0.04 [IU]/mL
QuantiFERON TB2 Ag Value: 0.04 [IU]/mL

## 2024-07-29 ENCOUNTER — Inpatient Hospital Stay: Attending: Oncology

## 2024-07-29 DIAGNOSIS — Z793 Long term (current) use of hormonal contraceptives: Secondary | ICD-10-CM | POA: Diagnosis not present

## 2024-07-29 DIAGNOSIS — N390 Urinary tract infection, site not specified: Secondary | ICD-10-CM | POA: Insufficient documentation

## 2024-07-29 DIAGNOSIS — D75839 Thrombocytosis, unspecified: Secondary | ICD-10-CM | POA: Insufficient documentation

## 2024-07-29 DIAGNOSIS — D72829 Elevated white blood cell count, unspecified: Secondary | ICD-10-CM | POA: Insufficient documentation

## 2024-07-29 DIAGNOSIS — D509 Iron deficiency anemia, unspecified: Secondary | ICD-10-CM | POA: Insufficient documentation

## 2024-07-29 DIAGNOSIS — N920 Excessive and frequent menstruation with regular cycle: Secondary | ICD-10-CM | POA: Insufficient documentation

## 2024-07-29 DIAGNOSIS — E538 Deficiency of other specified B group vitamins: Secondary | ICD-10-CM | POA: Diagnosis not present

## 2024-07-29 DIAGNOSIS — D500 Iron deficiency anemia secondary to blood loss (chronic): Secondary | ICD-10-CM

## 2024-07-29 LAB — COMPREHENSIVE METABOLIC PANEL WITH GFR
ALT: 20 U/L (ref 0–44)
AST: 20 U/L (ref 15–41)
Albumin: 3.4 g/dL — ABNORMAL LOW (ref 3.5–5.0)
Alkaline Phosphatase: 85 U/L (ref 38–126)
Anion gap: 10 (ref 5–15)
BUN: 9 mg/dL (ref 6–20)
CO2: 22 mmol/L (ref 22–32)
Calcium: 8.6 mg/dL — ABNORMAL LOW (ref 8.9–10.3)
Chloride: 104 mmol/L (ref 98–111)
Creatinine, Ser: 0.71 mg/dL (ref 0.44–1.00)
GFR, Estimated: >60 mL/min (ref >60)
Glucose, Bld: 86 mg/dL (ref 70–99)
Potassium: 3.7 mmol/L (ref 3.5–5.1)
Sodium: 136 mmol/L (ref 135–145)
Total Bilirubin: 0.4 mg/dL (ref 0.0–1.2)
Total Protein: 7.3 g/dL (ref 6.5–8.1)

## 2024-07-29 LAB — CBC WITH DIFFERENTIAL/PLATELET
Abs Immature Granulocytes: 0.08 K/uL — ABNORMAL HIGH (ref 0.00–0.07)
Basophils Absolute: 0.1 K/uL (ref 0.0–0.1)
Basophils Relative: 1 %
Eosinophils Absolute: 0.2 K/uL (ref 0.0–0.5)
Eosinophils Relative: 1 %
HCT: 44 % (ref 36.0–46.0)
Hemoglobin: 14.8 g/dL (ref 12.0–15.0)
Immature Granulocytes: 1 %
Lymphocytes Relative: 20 %
Lymphs Abs: 2.8 K/uL (ref 0.7–4.0)
MCH: 28.8 pg (ref 26.0–34.0)
MCHC: 33.6 g/dL (ref 30.0–36.0)
MCV: 85.8 fL (ref 80.0–100.0)
Monocytes Absolute: 0.8 K/uL (ref 0.1–1.0)
Monocytes Relative: 6 %
Neutro Abs: 10.1 K/uL — ABNORMAL HIGH (ref 1.7–7.7)
Neutrophils Relative %: 71 %
Platelets: 378 K/uL (ref 150–400)
RBC: 5.13 MIL/uL — ABNORMAL HIGH (ref 3.87–5.11)
RDW: 13.7 % (ref 11.5–15.5)
WBC: 14 K/uL — ABNORMAL HIGH (ref 4.0–10.5)
nRBC: 0 % (ref 0.0–0.2)

## 2024-07-29 LAB — IRON AND TIBC
Iron: 44 ug/dL (ref 28–170)
Saturation Ratios: 13 % (ref 10.4–31.8)
TIBC: 345 ug/dL (ref 250–450)
UIBC: 301 ug/dL

## 2024-07-29 LAB — VITAMIN B12: Vitamin B-12: 253 pg/mL (ref 180–914)

## 2024-07-29 LAB — FOLATE: Folate: 8.4 ng/mL (ref >5.9)

## 2024-07-29 LAB — FERRITIN: Ferritin: 530 ng/mL — ABNORMAL HIGH (ref 11–307)

## 2024-08-05 ENCOUNTER — Inpatient Hospital Stay: Admitting: Oncology

## 2024-08-13 NOTE — Assessment & Plan Note (Addendum)
 Iron deficiency anemia likely secondary to menorrhagia.  Iron levels improved after IV iron infusion with a drop again now and TSAT.   Patient is also started on birth control and has since decreased menstrual bleeding.  -Continue oral iron supplementation at this time every other day -Will do 1 more dose of IV iron to replenish iron stores -Encouraged to eat healthy diet with increased protein and green leafy vegetables  Return to clinic in 2 months with labs

## 2024-08-13 NOTE — Assessment & Plan Note (Addendum)
 Likely reactive secondary to iron deficiency. Resolved at this time  -Continue oral iron supplementation -Continue to monitor levels

## 2024-08-13 NOTE — Assessment & Plan Note (Addendum)
 Heavy menstrual bleeding has improved since starting birth control, but still present. -Continue current birth control regimen.

## 2024-08-13 NOTE — Progress Notes (Unsigned)
 [facility] OFFICE PROGRESS NOTE  Cook, Jayce G, DO  ASSESSMENT & PLAN:    Assessment & Plan Menorrhagia with regular cycle Heavy menstrual bleeding has improved since starting birth control, but still present. -Continue current birth control regimen. Iron deficiency anemia due to chronic blood loss Iron deficiency anemia likely secondary to menorrhagia.  She has received 4 doses of 510 mg IV Feraheme last given on 06/08/2024 and 06/15/2024. Iron levels improved after IV iron infusion.  Iron saturations are still on the lower side of normal but are normal. Had reaction with first dose- N/V, Dizzy, lightheadness- Added premeds and has been fine ever since. Patient is also started on birth control and has since decreased menstrual bleeding.  -Continue oral iron supplementation at this time every other day. -Recommend 1 additional dose of Feraheme given saturations are still on the lower side of normal. -Encouraged to eat healthy diet with increased protein and green leafy vegetables.  Return to clinic in 3 months with labs. Thrombocytosis Likely reactive secondary to iron deficiency. Resolved at this time  -Continue oral iron supplementation -Continue to monitor levels Vitamin B12 deficiency Patient has moderate vitamin B12 deficiency. B12 level improved but is now decreased. Recommend B12 injection today and continue oral B12 supplementation.  -Continue cyanocobalamin  1000 mcg daily.   Leukocytosis, unspecified type She has had some elevated white counts intermittently. Leukocytosis likely secondary to obesity and stressors.  She just recently started nursing school. 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  RF negative.  ESR normal, CRP increased. ANA: Negative  - Patient is being worked up by urology for chronic UTIs - Continue to monitor counts - No further workup needed at this time  Orders Placed This Encounter  Procedures   Comprehensive metabolic  panel with GFR    Standing Status:   Future    Expected Date:   11/14/2024    Expiration Date:   02/12/2025   CBC with Differential/Platelet    Standing Status:   Future    Expected Date:   11/14/2024    Expiration Date:   02/12/2025   Iron and TIBC    Standing Status:   Future    Expected Date:   11/14/2024    Expiration Date:   02/12/2025   Vitamin B12    Standing Status:   Future    Expected Date:   11/14/2024    Expiration Date:   02/12/2025   Folate    Standing Status:   Future    Expected Date:   11/14/2024    Expiration Date:   02/12/2025   Ferritin    Standing Status:   Future    Expected Date:   11/14/2024    Expiration Date:   02/12/2025    INTERVAL HISTORY: Patient returns for follow-up for iron deficiency anemia.  Received 2 dose of IV iron on 6/23 and 6/30 with great tolerance.   Reports menstrual cycles have improved since she started birth control.  Denies any bright red blood per rectum, melena or hematochezia.  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  Denies any B symptoms.  Appetite and weight are stable.  We reviewed cbc, cmp, iron panel, vit b12, folate, ferritin.   SUMMARY OF HEMATOLOGIC HISTORY: Oncology History   No history exists.     CBC    Component Value Date/Time   WBC 14.0 (H) 07/29/2024 1432  RBC 5.13 (H) 07/29/2024 1432   HGB 14.8 07/29/2024 1432   HGB 13.7 10/23/2023 1649   HCT 44.0 07/29/2024 1432   HCT 43.5 10/23/2023 1649   PLT 378 07/29/2024 1432   PLT 438 10/23/2023 1649   MCV 85.8 07/29/2024 1432   MCV 80 10/23/2023 1649   MCH 28.8 07/29/2024 1432   MCHC 33.6 07/29/2024 1432   RDW 13.7 07/29/2024 1432   RDW 15.5 (H) 10/23/2023 1649   LYMPHSABS 2.8 07/29/2024 1432   LYMPHSABS 3.1 10/23/2023 1649   MONOABS 0.8 07/29/2024 1432   EOSABS 0.2 07/29/2024 1432   EOSABS 0.2 10/23/2023 1649   BASOSABS 0.1 07/29/2024 1432   BASOSABS 0.1  10/23/2023 1649       Latest Ref Rng & Units 07/29/2024    2:32 PM 05/21/2024    2:11 PM 02/20/2024    1:58 PM  CMP  Glucose 70 - 99 mg/dL 86  80  88   BUN 6 - 20 mg/dL 9  9  11    Creatinine 0.44 - 1.00 mg/dL 9.28  9.25  8.97   Sodium 135 - 145 mmol/L 136  142  139   Potassium 3.5 - 5.1 mmol/L 3.7  3.9  3.9   Chloride 98 - 111 mmol/L 104  105  104   CO2 22 - 32 mmol/L 22  27  25    Calcium 8.9 - 10.3 mg/dL 8.6  8.9  9.2   Total Protein 6.5 - 8.1 g/dL 7.3  7.4  7.6   Total Bilirubin 0.0 - 1.2 mg/dL 0.4  0.4  0.4   Alkaline Phos 38 - 126 U/L 85  93  86   AST 15 - 41 U/L 20  24  18    ALT 0 - 44 U/L 20  31  31       Lab Results  Component Value Date   FERRITIN 530 (H) 07/29/2024   VITAMINB12 253 07/29/2024    Vitals:   08/14/24 0951  BP: 139/83  Pulse: 76  Resp: 18  Temp: 98.3 F (36.8 C)  SpO2: 98%    Review of System:  Review of Systems  Neurological:  Positive for headaches.  Psychiatric/Behavioral:  The patient has insomnia.     Physical Exam: Physical Exam Constitutional:      Appearance: Normal appearance. She is obese.  HENT:     Head: Normocephalic and atraumatic.  Eyes:     Pupils: Pupils are equal, round, and reactive to light.  Cardiovascular:     Rate and Rhythm: Normal rate and regular rhythm.     Heart sounds: Normal heart sounds. No murmur heard. Pulmonary:     Effort: Pulmonary effort is normal.     Breath sounds: Normal breath sounds. No wheezing.  Abdominal:     General: Bowel sounds are normal. There is no distension.     Palpations: Abdomen is soft.     Tenderness: There is no abdominal tenderness.  Musculoskeletal:        General: Normal range of motion.     Cervical back: Normal range of motion.  Skin:    General: Skin is warm and dry.     Findings: No rash.  Neurological:     Mental Status: She is alert and oriented to person, place, and time.     Gait: Gait is intact.  Psychiatric:        Mood and Affect: Mood and affect normal.         Cognition and  Memory: Memory normal.        Judgment: Judgment normal.      I spent 20 minutes dedicated to the care of this patient (face-to-face and non-face-to-face) on the date of the encounter to include what is described in the assessment and plan.,  Delon Hope, NP 08/14/2024 10:12 AM

## 2024-08-13 NOTE — Assessment & Plan Note (Addendum)
 Patient has moderate vitamin B12 deficiency. Improved with oral B12 supplementation  -Continue cyanocobalamin  1000 mcg daily.

## 2024-08-14 ENCOUNTER — Inpatient Hospital Stay (HOSPITAL_BASED_OUTPATIENT_CLINIC_OR_DEPARTMENT_OTHER): Admitting: Oncology

## 2024-08-14 VITALS — BP 139/83 | HR 76 | Temp 98.3°F | Resp 18 | Wt 304.2 lb

## 2024-08-14 DIAGNOSIS — D500 Iron deficiency anemia secondary to blood loss (chronic): Secondary | ICD-10-CM

## 2024-08-14 DIAGNOSIS — D75839 Thrombocytosis, unspecified: Secondary | ICD-10-CM | POA: Diagnosis not present

## 2024-08-14 DIAGNOSIS — N920 Excessive and frequent menstruation with regular cycle: Secondary | ICD-10-CM | POA: Diagnosis not present

## 2024-08-14 DIAGNOSIS — D509 Iron deficiency anemia, unspecified: Secondary | ICD-10-CM | POA: Diagnosis not present

## 2024-08-14 DIAGNOSIS — D72829 Elevated white blood cell count, unspecified: Secondary | ICD-10-CM

## 2024-08-14 DIAGNOSIS — E538 Deficiency of other specified B group vitamins: Secondary | ICD-10-CM

## 2024-08-14 NOTE — Assessment & Plan Note (Addendum)
 She has had some elevated white counts intermittently. Leukocytosis likely secondary to obesity and stressors.  She just recently started nursing school. 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  RF negative.  ESR normal, CRP increased. ANA: Negative  - Patient is being worked up by urology for chronic UTIs - Continue to monitor counts - No further workup needed at this time

## 2024-08-21 ENCOUNTER — Inpatient Hospital Stay: Attending: Oncology

## 2024-08-21 ENCOUNTER — Inpatient Hospital Stay

## 2024-08-21 VITALS — BP 119/70 | HR 78 | Temp 98.2°F

## 2024-08-21 VITALS — BP 142/80 | HR 85 | Temp 98.5°F | Resp 18

## 2024-08-21 DIAGNOSIS — D509 Iron deficiency anemia, unspecified: Secondary | ICD-10-CM | POA: Insufficient documentation

## 2024-08-21 DIAGNOSIS — D72829 Elevated white blood cell count, unspecified: Secondary | ICD-10-CM | POA: Insufficient documentation

## 2024-08-21 DIAGNOSIS — E538 Deficiency of other specified B group vitamins: Secondary | ICD-10-CM | POA: Insufficient documentation

## 2024-08-21 DIAGNOSIS — D500 Iron deficiency anemia secondary to blood loss (chronic): Secondary | ICD-10-CM

## 2024-08-21 DIAGNOSIS — N920 Excessive and frequent menstruation with regular cycle: Secondary | ICD-10-CM | POA: Insufficient documentation

## 2024-08-21 DIAGNOSIS — D75839 Thrombocytosis, unspecified: Secondary | ICD-10-CM | POA: Diagnosis not present

## 2024-08-21 MED ORDER — METHYLPREDNISOLONE SODIUM SUCC 125 MG IJ SOLR
125.0000 mg | Freq: Once | INTRAMUSCULAR | Status: AC
  Administered 2024-08-21: 125 mg via INTRAVENOUS
  Filled 2024-08-21: qty 2

## 2024-08-21 MED ORDER — CETIRIZINE HCL 10 MG PO TABS
10.0000 mg | ORAL_TABLET | Freq: Once | ORAL | Status: AC
  Administered 2024-08-21: 10 mg via ORAL
  Filled 2024-08-21: qty 1

## 2024-08-21 MED ORDER — SODIUM CHLORIDE 0.9 % IV SOLN
INTRAVENOUS | Status: DC
Start: 2024-08-21 — End: 2024-08-21

## 2024-08-21 MED ORDER — FAMOTIDINE IN NACL 20-0.9 MG/50ML-% IV SOLN
20.0000 mg | Freq: Once | INTRAVENOUS | Status: AC
  Administered 2024-08-21: 20 mg via INTRAVENOUS
  Filled 2024-08-21: qty 50

## 2024-08-21 MED ORDER — CYANOCOBALAMIN 1000 MCG/ML IJ SOLN
1000.0000 ug | Freq: Once | INTRAMUSCULAR | Status: AC
  Administered 2024-08-21: 1000 ug via INTRAMUSCULAR
  Filled 2024-08-21: qty 1

## 2024-08-21 MED ORDER — SODIUM CHLORIDE 0.9 % IV SOLN
510.0000 mg | Freq: Once | INTRAVENOUS | Status: AC
  Administered 2024-08-21: 510 mg via INTRAVENOUS
  Filled 2024-08-21: qty 17

## 2024-08-21 MED ORDER — ACETAMINOPHEN 325 MG PO TABS
650.0000 mg | ORAL_TABLET | Freq: Once | ORAL | Status: AC
  Administered 2024-08-21: 650 mg via ORAL
  Filled 2024-08-21: qty 2

## 2024-08-21 MED FILL — Ferumoxytol Inj 510 MG/17ML (30 MG/ML) (Elemental Fe): INTRAVENOUS | Qty: 17 | Status: AC

## 2024-08-21 NOTE — Patient Instructions (Signed)
 [facility] [place] - A DEPT OF [facility]  Discharge Instructions: Thank you for choosing [facility] to provide your oncology and hematology care.  If you have a lab appointment with the Cancer Center - please note that after April 8th, 2024, all labs will be drawn in the cancer center.  You do not have to check in or register with the main entrance as you have in the past but will complete your check-in in the cancer center.  Wear comfortable clothing and clothing appropriate for easy access to any Portacath or PICC line.   We strive to give you quality time with your provider. You may need to reschedule your appointment if you arrive late (15 or more minutes).  Arriving late affects you and other patients whose appointments are after yours.  Also, if you miss three or more appointments without notifying the office, you may be dismissed from the clinic at the provider's discretion.      For prescription refill requests, have your pharmacy contact our office and allow 72 hours for refills to be completed.    Today you received the following chemotherapy and/or immunotherapy agents Feraheme. Ferumoxytol Injection What is this medication? FERUMOXYTOL (FER ue MOX i tol) treats low levels of iron in your body (iron deficiency anemia). Iron is a mineral that plays an important role in making red blood cells, which carry oxygen from your lungs to the rest of your body. This medicine may be used for other purposes; ask your health care provider or pharmacist if you have questions. COMMON BRAND NAME(S): Feraheme What should I tell my care team before I take this medication? They need to know if you have any of these conditions: Anemia not caused by low iron levels High levels of iron in the blood Magnetic resonance imaging (MRI) test scheduled An unusual or allergic reaction to iron, other medications, foods, dyes, or preservatives Pregnant or trying to get  pregnant Breastfeeding How should I use this medication? This medication is injected into a vein. It is given by your care team in a hospital or clinic setting. Talk to your care team the use of this medication in children. Special care may be needed. Overdosage: If you think you have taken too much of this medicine contact a poison control center or emergency room at once. NOTE: This medicine is only for you. Do not share this medicine with others. What if I miss a dose? It is important not to miss your dose. Call your care team if you are unable to keep an appointment. What may interact with this medication? Other iron products This list may not describe all possible interactions. Give your health care provider a list of all the medicines, herbs, non-prescription drugs, or dietary supplements you use. Also tell them if you smoke, drink alcohol, or use illegal drugs. Some items may interact with your medicine. What should I watch for while using this medication? Visit your care team for regular checks on your progress. Tell your care team if your symptoms do not start to get better or if they get worse. You may need blood work done while you are taking this medication. You may need to eat more foods that contain iron. Talk to your care team. Foods that contain iron include whole grains or cereals, dried fruits, beans, peas, leafy green vegetables, and organ meats (liver, kidney). What side effects may I notice from receiving this medication? Side effects that  you should report to your care team as soon as possible: Allergic reactions--skin rash, itching, hives, swelling of the face, lips, tongue, or throat Low blood pressure--dizziness, feeling faint or lightheaded, blurry vision Shortness of breath Side effects that usually do not require medical attention (report to your care team if they continue or are bothersome): Flushing Headache Joint pain Muscle pain Nausea Pain, redness, or  irritation at injection site This list may not describe all possible side effects. Call your doctor for medical advice about side effects. You may report side effects to FDA at 1-800-FDA-1088. Where should I keep my medication? This medication is given in a hospital or clinic. It will not be stored at home. NOTE: This sheet is a summary. It may not cover all possible information. If you have questions about this medicine, talk to your doctor, pharmacist, or health care provider.  2024 Elsevier/Gold Standard (2023-07-24 00:00:00)      To help prevent nausea and vomiting after your treatment, we encourage you to take your nausea medication as directed.  BELOW ARE SYMPTOMS THAT SHOULD BE REPORTED IMMEDIATELY: *FEVER GREATER THAN 100.4 F (38 C) OR HIGHER *CHILLS OR SWEATING *NAUSEA AND VOMITING THAT IS NOT CONTROLLED WITH YOUR NAUSEA MEDICATION *UNUSUAL SHORTNESS OF BREATH *UNUSUAL BRUISING OR BLEEDING *URINARY PROBLEMS (pain or burning when urinating, or frequent urination) *BOWEL PROBLEMS (unusual diarrhea, constipation, pain near the anus) TENDERNESS IN MOUTH AND THROAT WITH OR WITHOUT PRESENCE OF ULCERS (sore throat, sores in mouth, or a toothache) UNUSUAL RASH, SWELLING OR PAIN  UNUSUAL VAGINAL DISCHARGE OR ITCHING   Items with * indicate a potential emergency and should be followed up as soon as possible or go to the Emergency Department if any problems should occur.  Please show the CHEMOTHERAPY ALERT CARD or IMMUNOTHERAPY ALERT CARD at check-in to the Emergency Department and triage nurse.  Should you have questions after your visit or need to cancel or reschedule your appointment, please contact [facility] [place] - A DEPT OF Eligha Bridegroom [facility] [phone]  and follow the prompts.  Office hours are 8:00 a.m. to 4:30 p.m. Monday - Friday. Please note that voicemails left after 4:00 p.m. may not be returned until the following business day.  We are closed weekends  and major holidays. You have access to a nurse at all times for urgent questions. Please call the main number to the clinic [phone] and follow the prompts.  For any non-urgent questions, you may also contact your provider using MyChart. We now offer e-Visits for anyone 67 and older to request care online for non-urgent symptoms. For details visit [email or web address].   Also download the MyChart app! Go to the app store, search "MyChart", open the app, select [place], and log in with your MyChart username and password.

## 2024-08-21 NOTE — Progress Notes (Signed)
 Feraheme given today per MD orders. Tolerated infusion without adverse affects. Vital signs stable. No complaints at this time. Discharged from clinic ambulatory in stable condition. Alert and oriented x 3. F/U with [facility] as scheduled.  

## 2024-08-21 NOTE — Progress Notes (Signed)
 Patient presents today for B12 injection and Feraheme  infusion. Patient tolerated injection left deltoid with no complaints voiced.  Site clean and dry with no bruising or swelling noted.  No complaints of pain.

## 2024-08-26 ENCOUNTER — Ambulatory Visit: Payer: Self-pay

## 2024-08-26 NOTE — Telephone Encounter (Signed)
 FYI Only or Action Required?: Action required by provider: request for appointment.  Patient was last seen in primary care on 07/17/2024 by [place], [place], [place].  Called Nurse Triage reporting Adenopathy.  Symptoms began yesterday.  Interventions attempted: Nothing.  Symptoms are: stable. Swollen lymph node , right side of neck. Tender to touch.  Triage Disposition: See Physician Within 24 Hours  Patient/caregiver understands and will follow disposition?: Yes   Copied from CRM #[id number]. Topic: Clinical - Red Word Triage >> Aug 26, 2024 12:27 PM DeAngela L wrote: Red Word that prompted transfer to Nurse Triage: swollen lymph node patient noticed on her right side when she woke up in the morning, and she had a congestion concerns this goes away so she is not sure why she woke up to the swelling   Pt num [id number] (H) Answer Assessment - Initial Assessment Questions 1. LOCATION: Where is the swollen node located? Is the matching node on the other side of the body also swollen?      Right neck 2. SIZE: How big is the node? (e.g., inches or centimeters; or compared to common objects such as pea, bean, marble, golf ball)      coin 3. ONSET: When did the swelling start?      yesterday 4. NECK NODES: Is there a sore throat, runny nose or other symptoms of a cold?      Runny nose , cough 5. GROIN OR ARMPIT NODES: Is there a sore, scratch, cut or painful red area on that arm or leg?      no 6. FEVER: Do you have a fever? If Yes, ask: What is it, how was it measured, and when did it start?      no 7. CAUSE: What do you think is causing the swollen lymph nodes?     unsure 8. OTHER SYMPTOMS: Do you have any other symptoms? (e.g., node is tender to touch, skin redness over node, weight changes)     tender 9. PREGNANCY: Is there any chance you are pregnant? When was your last menstrual period?     no  Protocols used: Lymph Nodes - Swollen-A-AH  Reason for  Disposition  [1] Single large node AND [2] size > 1 inch (2.5 cm) AND [3] no fever  Answer Assessment - Initial Assessment Questions 1. LOCATION: Where is the swollen node located? Is the matching node on the other side of the body also swollen?      Right neck 2. SIZE: How big is the node? (e.g., inches or centimeters; or compared to common objects such as pea, bean, marble, golf ball)      coin 3. ONSET: When did the swelling start?      yesterday 4. NECK NODES: Is there a sore throat, runny nose or other symptoms of a cold?      Runny nose , cough 5. GROIN OR ARMPIT NODES: Is there a sore, scratch, cut or painful red area on that arm or leg?      no 6. FEVER: Do you have a fever? If Yes, ask: What is it, how was it measured, and when did it start?      no 7. CAUSE: What do you think is causing the swollen lymph nodes?     unsure 8. OTHER SYMPTOMS: Do you have any other symptoms? (e.g., node is tender to touch, skin redness over node, weight changes)     tender 9. PREGNANCY: Is there any chance you are pregnant? When was  your last menstrual period?     no  Protocols used: Lymph Nodes - Swollen-A-AH

## 2024-08-27 ENCOUNTER — Ambulatory Visit (INDEPENDENT_AMBULATORY_CARE_PROVIDER_SITE_OTHER): Admitting: Nurse Practitioner

## 2024-08-27 VITALS — BP 103/71 | HR 89 | Temp 98.2°F | Ht 67.0 in | Wt 305.0 lb

## 2024-08-27 DIAGNOSIS — I889 Nonspecific lymphadenitis, unspecified: Secondary | ICD-10-CM

## 2024-08-27 DIAGNOSIS — B9689 Other specified bacterial agents as the cause of diseases classified elsewhere: Secondary | ICD-10-CM

## 2024-08-27 DIAGNOSIS — J069 Acute upper respiratory infection, unspecified: Secondary | ICD-10-CM

## 2024-08-27 DIAGNOSIS — R197 Diarrhea, unspecified: Secondary | ICD-10-CM | POA: Diagnosis not present

## 2024-08-27 MED ORDER — AMOXICILLIN-POT CLAVULANATE 875-125 MG PO TABS: 1.0000 | ORAL_TABLET | Freq: Two times a day (BID) | ORAL | 0 refills | Status: DC

## 2024-08-27 NOTE — Progress Notes (Signed)
 "  Subjective:    Patient ID: Alison Bass, female    DOB: 2001-07-07, 22 y.o.   MRN: [id number]  HPI Right side neck swollen lymph node   Pt is currently going to school so wants to be sure does not have anything infectious, she had known exposure to the flu by her boyfriend about 2 weeks ago, her flu test at home was negative Has congestion and sore throat only in the morning Discussed the use of AI scribe software for clinical note transcription with the patient, who gave verbal consent to proceed.  History of Present Illness Alison Bass is a 23 year old female who presents with swollen lymph node and upper respiratory symptoms.  She noticed a right sided swollen lymph node in the neck on Tuesday, which she discovered by touch. It was larger than normal compared to the other side.   She has been experiencing head congestion and a clear cough for about a week. She wakes up feeling unwell with a sore throat, particularly in the mornings, which improves after drinking fluids. No chest pain, wheezing, or fever. She reports sinus pressure and headache starting today, with pressure localized around her facial area.  Her boyfriend has had flu-like symptoms, but she has not been in close contact with him recently. She self-tested for flu, which was negative. She has experienced diarrhea and stomach cramps in the last 24 hours, which she attributes to something she ate. This has resolved.  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 No ear pain, but she reports a sore throat and sinus pressure. No scalp problems or tick bites. She has a history of receiving the flu shot annually and is scheduled to receive it again soon. Taking fluids well. Voiding nl.   Review of Systems  Constitutional:  Negative for fever.  HENT:  Positive for congestion, postnasal drip, sinus pressure and sore throat. Negative for ear pain.   Respiratory:   Positive for cough. Negative for chest tightness, wheezing and stridor.   Cardiovascular:  Negative for chest pain.  Gastrointestinal:  Positive for abdominal pain and diarrhea. Negative for blood in stool.   Social History   Tobacco Use   Smoking status: Never   Smokeless tobacco: Never  Vaping Use   Vaping status: Never Used  Substance Use Topics   Alcohol use: No   Drug use: No        Objective:   Physical Exam Vitals and nursing note reviewed.  Constitutional:      General: She is not in acute distress. HENT:     Ears:     Comments: Right TM partially obscured with cerumen.  Both TMs retracted with no erythema noted.    Nose:     Comments: Nasal mucosa pale and boggy.    Mouth/Throat:     Mouth: Mucous membranes are moist.     Comments: Pharynx minimally injected with green PND noted. Neck:     Comments: Mild anterior cervical adenopathy noted.  Large mildly tender rubbery mobile lymph node noted right posterior cervical area.  No other surrounding lymphadenopathy noted.  No occipital lymphadenopathy. Cardiovascular:     Rate and Rhythm: Normal rate and regular rhythm.  Pulmonary:     Effort: Pulmonary effort is normal.     Breath sounds: Normal breath sounds.  Abdominal:     General: There is no distension.     Palpations: Abdomen is soft.  Tenderness: There is no abdominal tenderness.  Musculoskeletal:     Cervical back: Neck supple. Tenderness present.  Lymphadenopathy:     Cervical: Cervical adenopathy present.  Skin:    General: Skin is warm and dry.  Neurological:     Mental Status: She is alert and oriented to person, place, and time.  Psychiatric:        Mood and Affect: Mood normal.        Behavior: Behavior normal.        Thought Content: Thought content normal.    Today's Vitals   08/27/24 1555  BP: 103/71  Pulse: 89  Temp: 98.2 F (36.8 C)  SpO2: 99%  Weight: (!) 305 lb (138.3 kg)  Height: 5' 7 (1.702 m)   Body mass index is 47.77  kg/m.         Assessment & Plan:  1. Bacterial URI (Primary) Greenish postnasal drainage suggests bacterial etiology. Diagnosis based on symptoms. - Prescribe antibiotics. Warning signs reviewed.  - Advise ibuprofen  for inflammation and soreness. - Recommend cold or heat application for comfort. - Consider mononucleosis testing if symptoms persist after seven days. - amoxicillin -clavulanate (AUGMENTIN ) 875-125 MG tablet; Take 1 tablet by mouth 2 (two) times daily.  Dispense: 14 tablet; Refill: 0  2. Cervical lymphadenitis Swollen, tender lymph node likely reactive to upper respiratory infection. Decreased size indicates improvement. - Monitor lymph node size and tenderness. - Advise ibuprofen  for inflammation and soreness. - Recommend cold or heat application for comfort. - Further investigation if lymph node persists or enlarges.  - amoxicillin -clavulanate (AUGMENTIN ) 875-125 MG tablet; Take 1 tablet by mouth 2 (two) times daily.  Dispense: 14 tablet; Refill: 0  3. Acute diarrhea Recent onset with stomach cramps, possibly dietary or viral. Similar symptoms in classmate suggest infectious cause. - Monitor symptoms and maintain hydration. - Consider further evaluation if symptoms persist or worsen.  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 Return if symptoms worsen or fail to improve.     "

## 2024-08-27 NOTE — Patient Instructions (Signed)
[facility] 

## 2024-08-28 ENCOUNTER — Encounter: Payer: Self-pay | Admitting: Nurse Practitioner

## 2024-08-28 DIAGNOSIS — R197 Diarrhea, unspecified: Secondary | ICD-10-CM | POA: Insufficient documentation

## 2024-09-01 ENCOUNTER — Ambulatory Visit (INDEPENDENT_AMBULATORY_CARE_PROVIDER_SITE_OTHER)

## 2024-09-01 DIAGNOSIS — Z23 Encounter for immunization: Secondary | ICD-10-CM | POA: Diagnosis not present

## 2024-09-09 ENCOUNTER — Telehealth: Admitting: Family Medicine

## 2024-09-09 DIAGNOSIS — B084 Enteroviral vesicular stomatitis with exanthem: Secondary | ICD-10-CM

## 2024-09-09 MED ORDER — LIDOCAINE VISCOUS HCL 2 % MT SOLN: 5.0000 mL | OROMUCOSAL | 0 refills | Status: DC | PRN

## 2024-09-09 NOTE — Progress Notes (Signed)
 E-Visit for Mouth Ulcers  We are sorry that you are not feeling well.  Here is how we plan to help!  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     The following medications should decrease the discomfort and help with healing. Viscous Lidocaine  2% Swish and spit out 5-10mL every 4-6 hours as needed for sore mouth.   Prevention:   Avoid allowing any tablets to dissolve in your mouth that are meant to swallowed whole Avoid foods/drinks that trigger or worsen symptoms Keep your mouth clean with daily brushing and flossing  Home Care: The goal with treatment is to ease the pain where ulcers occur and help them heal as quickly as possible.  There is no medical treatment to prevent mouth ulcers from coming back or recurring.  Avoid spicy and acidic foods Eat soft foods and avoid rough, crunchy foods Avoid chewing gum Do not use toothpaste that contains sodium lauryl sulphite Use a straw to drink which helps avoid liquids toughing the ulcers near the front of your mouth Use a very soft toothbrush If you have dentures or dental hardware that you feel is not fitting well or contributing to his, please see your dentist. Use saltwater mouthwash which helps healing. Dissolve a  teaspoon of salt in a glass of warm water. Swish around your mouth and spit it out. This can be used as needed if it is soothing.   GET HELP RIGHT AWAY IF: Persistent ulcers require checking IN PERSON (face to face). Any mouth lesion lasting longer than a month should be seen by your DENTIST as soon as possible for evaluation for possible oral cancer. If you have a non-painful ulcer in 1 or more areas of your mouth Ulcers that are spreading, are very large or particularly painful Ulcers last longer than one week without improving on treatment If you develop a fever, swollen glands and begin to feel unwell Ulcers that developed after starting a new  medication MAKE SURE YOU: Understand these instructions. Will watch your condition. Will get help right away if you are not doing well or get worse.  Thank you for choosing an e-visit.  Your e-visit answers were reviewed by a board certified advanced clinical practitioner to complete your personal care plan. Depending upon the condition, your plan could have included both over the counter or prescription medications.  Please review your pharmacy choice. Make sure the pharmacy is open so you can pick up prescription now. If there is a problem, you may contact your provider through [employer] and have the prescription routed to another pharmacy.  Your safety is important to us . If you have drug allergies check your prescription carefully.   For the next 24 hours you can use MyChart to ask questions about today's visit, request a non-urgent call back, or ask for a work or school excuse. You will get an email in the next two days asking about your experience. I hope that your e-visit has been valuable and will speed your recovery.    I have spent 5 minutes in review of e-visit questionnaire, review and updating patient chart, medical decision making and response to patient.   Delon CHRISTELLA Dickinson, PA-C

## 2024-09-10 ENCOUNTER — Ambulatory Visit: Payer: Self-pay

## 2024-09-10 ENCOUNTER — Other Ambulatory Visit: Payer: Self-pay

## 2024-09-10 ENCOUNTER — Ambulatory Visit: Admission: EM | Admit: 2024-09-10 | Discharge: 2024-09-10 | Disposition: A | Discharge status: Home / Self Care

## 2024-09-10 ENCOUNTER — Encounter: Payer: Self-pay | Admitting: Emergency Medicine

## 2024-09-10 DIAGNOSIS — B084 Enteroviral vesicular stomatitis with exanthem: Secondary | ICD-10-CM

## 2024-09-10 NOTE — Telephone Encounter (Signed)
 FYI Only or Action Required?: FYI only for provider.  Patient was last seen in primary care on 08/27/2024 by Mauro Elveria BROCKS, NP.  Called Nurse Triage reporting Blister and Mouth Lesions.  Symptoms began yesterday.  Interventions attempted: Rest, hydration, or home remedies.  Symptoms are: gradually worsening.  Triage Disposition: See Physician Within 24 Hours  Patient/caregiver understands and will follow disposition?: Yes      Copied from CRM [id number]. Topic: Clinical - Red Word Triage >> Sep 10, 2024  8:13 AM Carlatta H wrote: [facility] that prompted transfer to Nurse Triage: Patient was diagnosed with hands, foot and mouth yesterday//Today patient has worse symptoms// Blisters and bumps in mouth, hand and feet// Reason for Disposition  SEVERE throat pain (e.g., excruciating)  Additional Information  Commented on: Answer Assessment    Triager unable to schedule AV with PCP d/t no access, so offered to schedule at [facility]. Pt reports that she will go as walk-in .  Answer Assessment - Initial Assessment Questions 1. APPEARANCE of BLISTER: What does it look like?     Start as painful, red dots, but then turns into pimple-like 2. SIZE: How large is the blister? (e.g., inches, cm or compare to coins)     small 3. LOCATION: Where are the blisters located?      Hands, arms, feet/toes, mouth 4. WHEN: When did the blister happen?     yesterday 5. CAUSE: What do you think caused the blister?     Dx of HFM 6. PAIN: Does it hurt? If Yes, ask: How bad is the pain?  (Scale 0-10; or none, mild, moderate, severe)     Yes, but endorses sore throat +UOP 7. OTHER SYMPTOMS: Do you have any other symptoms? (e.g., fever)     Possible low grade fever, white patches on tonsils, diarrhea  Protocols used: Blister - Foot and Hand-A-AH, Sore Throat-A-AH

## 2024-09-10 NOTE — Telephone Encounter (Signed)
 Noted patient going to urgent care

## 2024-09-10 NOTE — ED Triage Notes (Signed)
 Pt reports has a televisit yesterday and was diagnosed with hand,foot, mouth. Pt reports was not prescribed any medication and reports now my throat is hurting. Denies any known fevers but reports chills and has been taking tylenol .

## 2024-09-10 NOTE — ED Provider Notes (Signed)
 [facility]    CSN: [id number] Arrival date & time: 09/10/24  0910      History   Chief Complaint Chief Complaint  Patient presents with   Rash    HPI Alison Bass is a 23 y.o. female.   Patient presenting today with rash to hands, feet, mouth, arms that started yesterday.  She states she did a televisit and was diagnosed with hand-foot-and-mouth and prescribed viscous lidocaine .  She did not realize prior to arrival today that she had been prescribed the viscous lidocaine  so she has not yet picked it up.  She also thinks that she has tonsillitis.  Denies fever, chills, difficulty breathing or swallowing, cough, congestion.  So far not tried anything over-the-counter for symptoms.    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 No pertinent surgical history.  OB History   No obstetric history on file.      Home Medications    Prior to Admission medications   Medication Sig Start Date End Date Taking? Authorizing Provider  amoxicillin -clavulanate (AUGMENTIN ) 875-125 MG  tablet Take 1 tablet by mouth 2 (two) times daily. 08/27/24   Mauro Elveria BROCKS, NP  cyanocobalamin  (VITAMIN B12) 1000 MCG tablet Take 1 tablet (1,000 mcg total) by mouth daily. 02/27/24   Kandala, Hyndavi, MD  fluconazole  (DIFLUCAN ) 150 MG tablet Take one tab po every 3 days x 3 doses then once a week 06/09/24   Hoskins, Carolyn C, NP  lidocaine  (XYLOCAINE ) 2 % solution Use as directed 5-10 mLs in the mouth or throat every 4 (four) hours as needed for mouth pain. Patient taking differently: Use as directed 5-10 mLs in the mouth or throat every 4 (four) hours as needed for mouth pain. 09/09/24   Vivienne Delon HERO, PA-C  metFORMIN  (GLUCOPHAGE ) 500 MG tablet TAKE 1 TABLET BY MOUTH 2 TIMES DAILY WITH A MEAL. 05/07/23   Cook, Jayce G, DO  norethindrone-ethinyl estradiol -FE (LOESTRIN FE 1/20) 1-20 MG-MCG tablet Take 1 tablet by mouth daily. Start pack on Sunday. 12/20/23   Hoskins, Carolyn C, NP  oxybutynin  (DITROPAN ) 5 MG tablet Take 1 tablet (5 mg total) by mouth every 8 (eight) hours as needed for bladder spasms. 03/27/24   Gerldine Lauraine BROCKS, FNP  spironolactone  (ALDACTONE ) 25 MG tablet Take 1 tablet (25 mg total) by mouth daily. For acne 12/20/23   Mauro Elveria BROCKS, NP    Family History Family History  Problem Relation Age of Onset   Diabetes Mother    Heart disease Paternal Grandfather        Died at 84    Social History Social History   Tobacco Use  Smoking status: Never   Smokeless tobacco: Never  Vaping Use   Vaping status: Never Used  Substance Use Topics   Alcohol use: No   Drug use: No     Allergies   Aloe, Cefzil [cefprozil], Dimetapp c [phenylephrine-bromphen-codeine], and Ferumoxytol    Review of Systems Review of Systems Per HPI  Physical Exam Triage Vital Signs ED Triage Vitals  Encounter Vitals Group     BP 09/10/24 0935 (!) 146/86     Girls Systolic BP Percentile --      Girls Diastolic BP Percentile --      Boys Systolic BP Percentile --      Boys Diastolic  BP Percentile --      Pulse Rate 09/10/24 0935 (!) 110     Resp 09/10/24 0935 20     Temp 09/10/24 0935 98.3 F (36.8 C)     Temp Source 09/10/24 0935 Oral     SpO2 09/10/24 0935 97 %     Weight --      Height --      Head Circumference --      Peak Flow --      Pain Score 09/10/24 0933 4     Pain Loc --      Pain Education --      Exclude from Growth Chart --    No data found.  Updated Vital Signs BP (!) 146/86 (BP Location: Left Arm)   Pulse (!) 110   Temp 98.3 F (36.8 C) (Oral)   Resp 20   LMP 09/09/2024   SpO2 97%   Visual Acuity Right Eye Distance:   Left Eye Distance:   Bilateral Distance:    Right Eye Near:   Left Eye Near:    Bilateral Near:     Physical Exam Vitals and nursing note reviewed.  Constitutional:      Appearance: Normal appearance. She is not ill-appearing.  HENT:     Head: Atraumatic.     Mouth/Throat:     Mouth: Mucous membranes are moist.     Pharynx: Posterior oropharyngeal erythema present. No oropharyngeal exudate.     Comments: Moderate bilateral tonsillar erythema, uvula midline, oral airway patent Eyes:     Extraocular Movements: Extraocular movements intact.     Conjunctiva/sclera: Conjunctivae normal.  Cardiovascular:     Rate and Rhythm: Normal rate.  Pulmonary:     Effort: Pulmonary effort is normal.  Musculoskeletal:        General: Normal range of motion.     Cervical back: Normal range of motion and neck supple.  Lymphadenopathy:     Cervical: No cervical adenopathy.  Skin:    General: Skin is warm and dry.     Findings: Rash present.     Comments: Erythematous papules and blisters to palms of hands, soles of feet, arms, oropharynx  Neurological:     Mental Status: She is alert and oriented to person, place, and time.     Motor: No weakness.     Gait: Gait normal.  Psychiatric:        Mood and Affect: Mood normal.        Thought Content: Thought content normal.        Judgment: Judgment normal.      UC  Treatments / Results  Labs (all labs ordered are listed, but only abnormal results are displayed) Labs Reviewed - No data to display  EKG   Radiology No results found.  Procedures Procedures (including critical  care time)  Medications Ordered in UC Medications - No data to display  Initial Impression / Assessment and Plan / UC Course  I have reviewed the triage vital signs and the nursing notes.  Pertinent labs & imaging results that were available during my care of the patient were reviewed by me and considered in my medical decision making (see chart for details).     Consistent with hand-foot-and-mouth disease.  Use the viscous lidocaine , supportive over-the-counter medications and home care.  Suspect tonsils inflamed due to viral illness.  Supportive measures reviewed and return precautions.  School note given.  Final Clinical Impressions(s) / UC Diagnoses   Final diagnoses:  Hand, foot and mouth disease   Discharge Instructions   None    ED Prescriptions   None    PDMP not reviewed this encounter.   Stuart Vernell Norris, [place] 09/10/24 1059

## 2024-09-12 ENCOUNTER — Telehealth: Admitting: Family

## 2024-09-12 DIAGNOSIS — B084 Enteroviral vesicular stomatitis with exanthem: Secondary | ICD-10-CM

## 2024-09-12 MED ORDER — LIDOCAINE VISCOUS HCL 2 % MT SOLN: 5.0000 mL | OROMUCOSAL | 0 refills | Status: DC | PRN

## 2024-09-12 NOTE — Progress Notes (Signed)
Approximately 5 minutes was spent documenting and reviewing patient's chart.

## 2024-09-12 NOTE — Progress Notes (Signed)
 E-Visit for Mouth Ulcers  We are sorry that you are not feeling well.  Here is how we plan to help!  Based on what you have shared with me, it appears that you do have mouth ulcer(s).     The following medications should decrease the discomfort and help with healing. I have sent in lidocaine  solution for you.   Mouth ulcers are painful areas in the mouth and gums.   Home Care: The goal with treatment is to ease the pain where ulcers occur and help them heal as quickly as possible.  There is no medical treatment to prevent mouth ulcers from coming back or recurring.  Avoid spicy and acidic foods Eat soft foods and avoid rough, crunchy foods Avoid chewing gum Do not use toothpaste that contains sodium lauryl sulphite Use a straw to drink which helps avoid liquids toughing the ulcers near the front of your mouth Use a very soft toothbrush If you have dentures or dental hardware that you feel is not fitting well or contributing to his, please see your dentist. Use saltwater mouthwash which helps healing. Dissolve a  teaspoon of salt in a glass of warm water. Swish around your mouth and spit it out. This can be used as needed if it is soothing.   GET HELP RIGHT AWAY IF: Persistent ulcers require checking IN PERSON (face to face). Any mouth lesion lasting longer than a month should be seen by your DENTIST as soon as possible for evaluation for possible oral cancer. If you have a non-painful ulcer in 1 or more areas of your mouth Ulcers that are spreading, are very large or particularly painful Ulcers last longer than one week without improving on treatment If you develop a fever, swollen glands and begin to feel unwell Ulcers that developed after starting a new medication MAKE SURE YOU: Understand these instructions. Will watch your condition. Will get help right away if you are not doing well or get worse.  Thank you for choosing an e-visit.  Your e-visit answers were reviewed by a  board certified advanced clinical practitioner to complete your personal care plan. Depending upon the condition, your plan could have included both over the counter or prescription medications.  Please review your pharmacy choice. Make sure the pharmacy is open so you can pick up prescription now. If there is a problem, you may contact your provider through [employer] and have the prescription routed to another pharmacy.  Your safety is important to us . If you have drug allergies check your prescription carefully.   For the next 24 hours you can use MyChart to ask questions about today's visit, request a non-urgent call back, or ask for a work or school excuse. You will get an email in the next two days asking about your experience. I hope that your e-visit has been valuable and will speed your recovery.

## 2024-10-23 ENCOUNTER — Encounter: Payer: Self-pay | Admitting: Obstetrics and Gynecology

## 2024-10-23 ENCOUNTER — Ambulatory Visit: Admitting: Obstetrics and Gynecology

## 2024-10-23 VITALS — BP 126/84 | HR 98 | Ht 67.5 in | Wt 311.8 lb

## 2024-10-23 DIAGNOSIS — M62838 Other muscle spasm: Secondary | ICD-10-CM | POA: Diagnosis not present

## 2024-10-23 DIAGNOSIS — R350 Frequency of micturition: Secondary | ICD-10-CM

## 2024-10-23 DIAGNOSIS — R3989 Other symptoms and signs involving the genitourinary system: Secondary | ICD-10-CM

## 2024-10-23 LAB — POCT URINALYSIS DIP (CLINITEK)
Bilirubin, UA: NEGATIVE
Blood, UA: NEGATIVE
Glucose, UA: NEGATIVE mg/dL
Ketones, POC UA: NEGATIVE mg/dL
Leukocytes, UA: NEGATIVE
Nitrite, UA: NEGATIVE
POC PROTEIN,UA: NEGATIVE
Spec Grav, UA: 1.02 (ref 1.010–1.025)
Urobilinogen, UA: 0.2 U/dL
pH, UA: 7 (ref 5.0–8.0)

## 2024-10-23 NOTE — Patient Instructions (Addendum)
 Today we talked about ways to manage bladder urgency such as altering your diet to avoid irritative beverages and foods (bladder diet) as well as attempting to decrease stress and other exacerbating factors.   There is a website with helpful information for people with bladder irritation, called the IC Network at https://www.ic-network.com. This website has more information about a healthy bladder diet and patient forums for support.  The Most Bothersome Foods* The Least Bothersome Foods*  Coffee - Regular & Decaf Tea - caffeinated Carbonated beverages - cola, non-colas, diet & caffeine-free Alcohols - Beer, Red Wine, White Wine, [address] - Grapefruit, [place], Orange, [employer] - Cranberry, Grapefruit, Orange, Pineapple Vegetables - Tomato & Tomato Products Flavor Enhancers - Hot peppers, Spicy foods, Chili, Horseradish, Vinegar, Monosodium glutamate (MSG) Artificial Sweeteners - NutraSweet, Sweet 'N Low, Equal (sweetener), Saccharin Ethnic foods - Mexican, Thai, Indian food [employer] - low-fat & whole Fruits - Bananas, Blueberries, Honeydew melon, Pears, Raisins, Watermelon Vegetables - Broccoli, [address] Sprouts, [place], Carrots, Cauliflower, [place], Cucumber, Mushrooms, Peas, Radishes, Squash, Zucchini, White potatoes, Sweet potatoes & yams Poultry - Chicken, Eggs, Turkey, [employer] - Beef, [employer], Lamb Seafood - Shrimp, [place] fish, Salmon Grains - Oat, Rice Snacks - Pretzels, Popcorn  *Mitch ALF et al. Diet and its role in interstitial cystitis/bladder pain syndrome (IC/BPS) and comorbid conditions. [employer]. BJU Int. 2012 Jan 11.    Interstitial Cystitis/ Bladder Pain treatment:  Avoid irriative foods and beverages and identify other triggers Work to decrease stress- meditation, yoga Medications for bladder pain: pyridium (Azo), methenamine, Uribel Medications: amitiptyline, hydroxyzine or claritin  (anti-histamine), tums, L-arginine, Elmiron  Bladder  instillations for pain flares Cystoscopy with hydrodistention Additional treatments to try for relief of IC symptoms:  Over the counter natural supplements: -Bladder Ease by [employer], Bladder Rest or Bladder Builder (all contain L-arginine) - helps to rebuild protective bladder layer and reduce bladder pain -Marshmallow Root (relieves inflammation in the urinary tract) - pills available or drink as a tea -Aloe Vera: [place] Aloe Vera capsules,  AloePath (also contains L-arginine and calcium cabonate)- can take several months to see improvement -Fish oil - 4 g daily -Tumeric (standardized to 95% curcuminoids) - 500 mg three times daily  -Mindfulness practice (yoga, meditation, deep breathing)   -If constipation as well, Magnesium supplement daily (reduces bladder spasms, helps constipation)) -If IBS as well, try low FODMAP or SIBO diet  - https://www.siboinfo.com/diet.html

## 2024-10-23 NOTE — Assessment & Plan Note (Signed)
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 She has noticed that stress increases her symptoms.  -  We also discussed using pyridium and similar over-the-counter medications for pain relief as needed. We discussed the pentad of medications including Elmiron, Tums, an antihistamine such as Vistaril or Claritin , amitriptyline, and L-arginine.   -  She was also given information on the IC Network at https://www.ic-network.com for bladder diet suggestions and patient forums for support.

## 2024-10-23 NOTE — Assessment & Plan Note (Signed)
-   The origin of pelvic floor muscle spasm can be multifactorial, including primary, reactive to a different pain source, trauma, or even part of a centralized pain syndrome.Treatment options include pelvic floor physical therapy, local (vaginal) or oral  muscle relaxants, pelvic muscle trigger point injections or centrally acting pain medications.   - She will start with pelvic PT, referral placed.
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
## 2024-11-10 ENCOUNTER — Inpatient Hospital Stay: Attending: Oncology

## 2024-11-10 DIAGNOSIS — N920 Excessive and frequent menstruation with regular cycle: Secondary | ICD-10-CM | POA: Insufficient documentation

## 2024-11-10 DIAGNOSIS — D509 Iron deficiency anemia, unspecified: Secondary | ICD-10-CM | POA: Diagnosis present

## 2024-11-10 DIAGNOSIS — E538 Deficiency of other specified B group vitamins: Secondary | ICD-10-CM | POA: Diagnosis not present

## 2024-11-10 DIAGNOSIS — D500 Iron deficiency anemia secondary to blood loss (chronic): Secondary | ICD-10-CM

## 2024-11-10 LAB — CBC WITH DIFFERENTIAL/PLATELET
Abs Immature Granulocytes: 0.09 K/uL — ABNORMAL HIGH (ref 0.00–0.07)
Basophils Absolute: 0.1 K/uL (ref 0.0–0.1)
Basophils Relative: 1 %
Eosinophils Absolute: 0.2 K/uL (ref 0.0–0.5)
Eosinophils Relative: 1 %
HCT: 44.7 % (ref 36.0–46.0)
Hemoglobin: 15.1 g/dL — ABNORMAL HIGH (ref 12.0–15.0)
Immature Granulocytes: 1 %
Lymphocytes Relative: 19 %
Lymphs Abs: 2.3 K/uL (ref 0.7–4.0)
MCH: 29.5 pg (ref 26.0–34.0)
MCHC: 33.8 g/dL (ref 30.0–36.0)
MCV: 87.5 fL (ref 80.0–100.0)
Monocytes Absolute: 0.7 K/uL (ref 0.1–1.0)
Monocytes Relative: 6 %
Neutro Abs: 8.7 K/uL — ABNORMAL HIGH (ref 1.7–7.7)
Neutrophils Relative %: 72 %
Platelets: 347 K/uL (ref 150–400)
RBC: 5.11 MIL/uL (ref 3.87–5.11)
RDW: 13.2 % (ref 11.5–15.5)
WBC: 12.1 K/uL — ABNORMAL HIGH (ref 4.0–10.5)
nRBC: 0 % (ref 0.0–0.2)

## 2024-11-10 LAB — COMPREHENSIVE METABOLIC PANEL WITH GFR
ALT: 40 U/L (ref 0–44)
AST: 21 U/L (ref 15–41)
Albumin: 4.1 g/dL (ref 3.5–5.0)
Alkaline Phosphatase: 116 U/L (ref 38–126)
Anion gap: 8 (ref 5–15)
BUN: 7 mg/dL (ref 6–20)
CO2: 26 mmol/L (ref 22–32)
Calcium: 9.1 mg/dL (ref 8.9–10.3)
Chloride: 106 mmol/L (ref 98–111)
Creatinine, Ser: 0.67 mg/dL (ref 0.44–1.00)
GFR, Estimated: >60 mL/min (ref >60)
Glucose, Bld: 97 mg/dL (ref 70–99)
Potassium: 4.6 mmol/L (ref 3.5–5.1)
Sodium: 139 mmol/L (ref 135–145)
Total Bilirubin: 0.5 mg/dL (ref 0.0–1.2)
Total Protein: 7.2 g/dL (ref 6.5–8.1)

## 2024-11-10 LAB — VITAMIN B12: Vitamin B-12: 543 pg/mL (ref 180–914)

## 2024-11-10 LAB — FOLATE: Folate: 6.8 ng/mL (ref >5.9)

## 2024-11-10 LAB — IRON AND TIBC
Iron: 83 ug/dL (ref 28–170)
Saturation Ratios: 23 % (ref 10.4–31.8)
TIBC: 353 ug/dL (ref 250–450)
UIBC: 270 ug/dL

## 2024-11-10 LAB — FERRITIN: Ferritin: 941 ng/mL — ABNORMAL HIGH (ref 11–307)

## 2024-11-17 ENCOUNTER — Inpatient Hospital Stay: Attending: Oncology | Admitting: Oncology

## 2024-11-17 DIAGNOSIS — E538 Deficiency of other specified B group vitamins: Secondary | ICD-10-CM | POA: Diagnosis not present

## 2024-11-17 DIAGNOSIS — D72829 Elevated white blood cell count, unspecified: Secondary | ICD-10-CM | POA: Diagnosis not present

## 2024-11-17 DIAGNOSIS — D75839 Thrombocytosis, unspecified: Secondary | ICD-10-CM

## 2024-11-17 DIAGNOSIS — D500 Iron deficiency anemia secondary to blood loss (chronic): Secondary | ICD-10-CM | POA: Diagnosis not present

## 2024-11-17 NOTE — Progress Notes (Signed)
 [facility] OFFICE PROGRESS NOTE  Cook, Jayce G, DO  ASSESSMENT & PLAN:  I connected with Alison Bass on 11/17/24 at  1:30 PM EST by telephone visit and verified that I am speaking with the correct person using two identifiers.   I discussed the limitations, risks, security and privacy concerns of performing an evaluation and management service by telemedicine and the availability of in-person appointments. I also discussed with the patient that there may be a patient responsible charge related to this service. The patient expressed understanding and agreed to proceed.   Other persons participating in the visit and their role in the encounter: NP, Patient    Patient's location: Home  Provider's location: Clinic   Assessment & Plan Vitamin B12 deficiency Patient has moderate vitamin B12 deficiency. B12 level improved.  -Continue cyanocobalamin  1000 mcg daily.   Thrombocytosis Likely reactive secondary to iron deficiency. Resolved at this time  -Continue oral iron supplementation -Continue to monitor levels Leukocytosis, unspecified type She has had some elevated white counts intermittently. Leukocytosis likely secondary to obesity and stressors.  She just recently started nursing school. 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  RF negative.  ESR normal, CRP increased. ANA: Negative  - Patient is being worked up by urology for chronic UTIs - Continue to monitor counts - No further workup needed at this time Iron deficiency anemia due to chronic blood loss Iron deficiency anemia likely secondary to menorrhagia.  She has received 5 doses of IV Feraheme  last given on 08/21/2024. Iron levels improved after IV iron infusion.  Iron saturations have improved and are 23%.  Ferritin is elevated but likely falsely in the setting of inflammation. Had reaction with first dose- N/V, Dizzy, lightheadness- Added premeds and has been fine ever since. Patient is also  started on birth control and has since decreased menstrual bleeding.  -Continue oral iron supplementation at this time every other day. -No additional IV iron needed at this time. -Encouraged to eat healthy diet with increased protein and green leafy vegetables.  Return to clinic in 4 months with labs.  Orders Placed This Encounter  Procedures   Comprehensive metabolic panel with GFR    Standing Status:   Future    Expected Date:   03/18/2025    Expiration Date:   06/16/2025   CBC with Differential/Platelet    Standing Status:   Future    Expected Date:   03/18/2025    Expiration Date:   06/16/2025   Iron and TIBC    Standing Status:   Future    Expected Date:   03/18/2025    Expiration Date:   06/16/2025   Vitamin B12    Standing Status:   Future    Expected Date:   03/18/2025    Expiration Date:   06/16/2025   Folate    Standing Status:   Future    Expected Date:   03/18/2025    Expiration Date:   06/16/2025   Ferritin    Standing Status:   Future    Expected Date:   03/18/2025    Expiration Date:   06/16/2025    INTERVAL HISTORY: Patient returns for follow-up for iron deficiency anemia.  Received 1 dose of IV Feraheme  on 08/21/2024 with good tolerance.  Prior to that she had received 4 other doses.  Reports menstrual cycles have improved since she started birth control.  Denies any bright red blood per rectum, melena or hematochezia.  Reports she  has not been taking her B12 supplement as she has been forgetting.  Overall feels improved after her last iron infusion.  Appetite and energy levels are 100%.  She denies any pain.  Denies any B symptoms.  We reviewed cbc, cmp, iron panel, vit b12, folate, ferritin.   SUMMARY OF HEMATOLOGIC HISTORY: Oncology History   No history exists.     CBC    Component Value Date/Time   WBC 12.1 (H) 11/10/2024 0956   RBC 5.11 11/10/2024 0956   HGB 15.1 (H) 11/10/2024 0956   HGB 13.7 10/23/2023 1649   HCT 44.7 11/10/2024 0956   HCT 43.5 10/23/2023  1649   PLT 347 11/10/2024 0956   PLT 438 10/23/2023 1649   MCV 87.5 11/10/2024 0956   MCV 80 10/23/2023 1649   MCH 29.5 11/10/2024 0956   MCHC 33.8 11/10/2024 0956   RDW 13.2 11/10/2024 0956   RDW 15.5 (H) 10/23/2023 1649   LYMPHSABS 2.3 11/10/2024 0956   LYMPHSABS 3.1 10/23/2023 1649   MONOABS 0.7 11/10/2024 0956   EOSABS 0.2 11/10/2024 0956   EOSABS 0.2 10/23/2023 1649   BASOSABS 0.1 11/10/2024 0956   BASOSABS 0.1 10/23/2023 1649       Latest Ref Rng & Units 11/10/2024    9:56 AM 07/29/2024    2:32 PM 05/21/2024    2:11 PM  CMP  Glucose 70 - 99 mg/dL 97  86  80   BUN 6 - 20 mg/dL 7  9  9    Creatinine 0.44 - 1.00 mg/dL 9.32  9.28  9.25   Sodium 135 - 145 mmol/L 139  136  142   Potassium 3.5 - 5.1 mmol/L 4.6  3.7  3.9   Chloride 98 - 111 mmol/L 106  104  105   CO2 22 - 32 mmol/L 26  22  27    Calcium 8.9 - 10.3 mg/dL 9.1  8.6  8.9   Total Protein 6.5 - 8.1 g/dL 7.2  7.3  7.4   Total Bilirubin 0.0 - 1.2 mg/dL 0.5  0.4  0.4   Alkaline Phos 38 - 126 U/L 116  85  93   AST 15 - 41 U/L 21  20  24    ALT 0 - 44 U/L 40  20  31      Lab Results  Component Value Date   FERRITIN 941 (H) 11/10/2024   VITAMINB12 543 11/10/2024    There were no vitals filed for this visit.   Review of System:  Review of Systems  All other systems reviewed and are negative.   Physical Exam: Physical Exam Neurological:     Mental Status: She is alert and oriented to person, place, and time.     I provided 18 minutes of non face-to-face telephone visit time during this encounter, and > 50% was spent counseling as documented under my assessment & plan.  Delon Hope, NP 11/17/2024 2:03 PM

## 2024-11-17 NOTE — Assessment & Plan Note (Addendum)
 Iron deficiency anemia likely secondary to menorrhagia.  She has received 5 doses of IV Feraheme  last given on 08/21/2024. Iron levels improved after IV iron infusion.  Iron saturations have improved and are 23%.  Ferritin is elevated but likely falsely in the setting of inflammation. Had reaction with first dose- N/V, Dizzy, lightheadness- Added premeds and has been fine ever since. Patient is also started on birth control and has since decreased menstrual bleeding.  -Continue oral iron supplementation at this time every other day. -No additional IV iron needed at this time. -Encouraged to eat healthy diet with increased protein and green leafy vegetables.  Return to clinic in 4 months with labs.

## 2024-11-17 NOTE — Assessment & Plan Note (Addendum)
 Patient has moderate vitamin B12 deficiency. B12 level improved.  -Continue cyanocobalamin  1000 mcg daily.

## 2024-11-17 NOTE — Assessment & Plan Note (Addendum)
 She has had some elevated white counts intermittently. Leukocytosis likely secondary to obesity and stressors.  She just recently started nursing school. 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  RF negative.  ESR normal, CRP increased. ANA: Negative  - Patient is being worked up by urology for chronic UTIs - Continue to monitor counts - No further workup needed at this time

## 2024-11-17 NOTE — Assessment & Plan Note (Addendum)
 Likely reactive secondary to iron deficiency. Resolved at this time  -Continue oral iron supplementation -Continue to monitor levels

## 2024-12-14 ENCOUNTER — Encounter: Payer: Self-pay | Admitting: *Deleted

## 2024-12-28 ENCOUNTER — Encounter: Payer: Self-pay | Admitting: *Deleted

## 2025-01-08 ENCOUNTER — Ambulatory Visit: Admitting: Physical Therapy

## 2025-03-12 ENCOUNTER — Inpatient Hospital Stay

## 2025-03-19 ENCOUNTER — Inpatient Hospital Stay: Admitting: Oncology
